# Patient Record
Sex: Male | Born: 1960
Health system: Southern US, Community
[De-identification: ages and names within clinical notes are randomized; demographics above are authoritative.]

## PROBLEM LIST (undated history)

## (undated) DIAGNOSIS — T7840XA Allergy, unspecified, initial encounter: Secondary | ICD-10-CM

## (undated) DIAGNOSIS — IMO0002 Reserved for concepts with insufficient information to code with codable children: Secondary | ICD-10-CM

## (undated) DIAGNOSIS — K219 Gastro-esophageal reflux disease without esophagitis: Secondary | ICD-10-CM

## (undated) DIAGNOSIS — R7309 Other abnormal glucose: Secondary | ICD-10-CM

## (undated) DIAGNOSIS — K648 Other hemorrhoids: Secondary | ICD-10-CM

## (undated) DIAGNOSIS — E785 Hyperlipidemia, unspecified: Secondary | ICD-10-CM

## (undated) DIAGNOSIS — N529 Male erectile dysfunction, unspecified: Secondary | ICD-10-CM

## (undated) DIAGNOSIS — K760 Fatty (change of) liver, not elsewhere classified: Secondary | ICD-10-CM

## (undated) DIAGNOSIS — L309 Dermatitis, unspecified: Secondary | ICD-10-CM

## (undated) DIAGNOSIS — R0683 Snoring: Secondary | ICD-10-CM

## (undated) DIAGNOSIS — I10 Essential (primary) hypertension: Secondary | ICD-10-CM

## (undated) DIAGNOSIS — Z86010 Personal history of colonic polyps: Secondary | ICD-10-CM

## (undated) HISTORY — DX: Reserved for concepts with insufficient information to code with codable children: IMO0002

## (undated) HISTORY — DX: Allergy, unspecified, initial encounter: T78.40XA

## (undated) HISTORY — DX: Dermatitis, unspecified: L30.9

## (undated) HISTORY — DX: Other hemorrhoids: K64.8

## (undated) HISTORY — DX: Personal history of colonic polyps: Z86.010

## (undated) HISTORY — DX: Other abnormal glucose: R73.09

## (undated) HISTORY — DX: Male erectile dysfunction, unspecified: N52.9

## (undated) HISTORY — DX: Snoring: R06.83

## (undated) HISTORY — DX: Gastro-esophageal reflux disease without esophagitis: K21.9

## (undated) HISTORY — DX: Essential (primary) hypertension: I10

## (undated) HISTORY — DX: Hyperlipidemia, unspecified: E78.5

## (undated) HISTORY — DX: Fatty (change of) liver, not elsewhere classified: K76.0

---

## 1988-03-14 HISTORY — PX: WISDOM TOOTH EXTRACTION: SHX21

## 1998-03-03 ENCOUNTER — Ambulatory Visit (HOSPITAL_COMMUNITY): Admission: RE | Admit: 1998-03-03 | Discharge: 1998-03-03 | Payer: Self-pay | Admitting: Internal Medicine

## 1998-03-03 ENCOUNTER — Encounter: Payer: Self-pay | Admitting: Internal Medicine

## 1998-10-27 ENCOUNTER — Ambulatory Visit (HOSPITAL_COMMUNITY): Admission: RE | Admit: 1998-10-27 | Discharge: 1998-10-27 | Payer: Self-pay | Admitting: Internal Medicine

## 1998-10-27 ENCOUNTER — Encounter: Payer: Self-pay | Admitting: Internal Medicine

## 2004-12-14 ENCOUNTER — Ambulatory Visit (HOSPITAL_COMMUNITY): Admission: RE | Admit: 2004-12-14 | Discharge: 2004-12-14 | Payer: Self-pay | Admitting: Internal Medicine

## 2004-12-14 ENCOUNTER — Ambulatory Visit: Payer: Self-pay | Admitting: Internal Medicine

## 2005-08-22 ENCOUNTER — Ambulatory Visit: Payer: Self-pay | Admitting: Internal Medicine

## 2006-01-13 ENCOUNTER — Ambulatory Visit: Payer: Self-pay | Admitting: Internal Medicine

## 2007-07-13 ENCOUNTER — Ambulatory Visit: Payer: Self-pay | Admitting: Internal Medicine

## 2007-07-13 DIAGNOSIS — J309 Allergic rhinitis, unspecified: Secondary | ICD-10-CM | POA: Insufficient documentation

## 2007-07-13 DIAGNOSIS — L299 Pruritus, unspecified: Secondary | ICD-10-CM | POA: Insufficient documentation

## 2007-07-13 DIAGNOSIS — R21 Rash and other nonspecific skin eruption: Secondary | ICD-10-CM | POA: Insufficient documentation

## 2007-07-16 LAB — CONVERTED CEMR LAB
ALT: 39 units/L (ref 0–53)
AST: 21 units/L (ref 0–37)
Albumin: 4.1 g/dL (ref 3.5–5.2)
Alkaline Phosphatase: 72 units/L (ref 39–117)
BUN: 11 mg/dL (ref 6–23)
Basophils Absolute: 0 10*3/uL (ref 0.0–0.1)
Basophils Relative: 0.7 % (ref 0.0–1.0)
Bilirubin, Direct: 0.2 mg/dL (ref 0.0–0.3)
CO2: 28 meq/L (ref 19–32)
Calcium: 9.2 mg/dL (ref 8.4–10.5)
Chloride: 111 meq/L (ref 96–112)
Creatinine, Ser: 1.1 mg/dL (ref 0.4–1.5)
Eosinophils Absolute: 0 10*3/uL (ref 0.0–0.7)
Eosinophils Relative: 0.7 % (ref 0.0–5.0)
GFR calc Af Amer: 93 mL/min
GFR calc non Af Amer: 77 mL/min
Glucose, Bld: 113 mg/dL — ABNORMAL HIGH (ref 70–99)
HCT: 45 % (ref 39.0–52.0)
Hemoglobin: 15.8 g/dL (ref 13.0–17.0)
Lymphocytes Relative: 23.4 % (ref 12.0–46.0)
MCHC: 35 g/dL (ref 30.0–36.0)
MCV: 91.4 fL (ref 78.0–100.0)
Monocytes Absolute: 0.4 10*3/uL (ref 0.1–1.0)
Monocytes Relative: 8.1 % (ref 3.0–12.0)
Neutro Abs: 3.7 10*3/uL (ref 1.4–7.7)
Neutrophils Relative %: 67.1 % (ref 43.0–77.0)
Platelets: 196 10*3/uL (ref 150–400)
Potassium: 4.6 meq/L (ref 3.5–5.1)
RBC: 4.92 M/uL (ref 4.22–5.81)
RDW: 11.5 % (ref 11.5–14.6)
Sodium: 141 meq/L (ref 135–145)
TSH: 0.74 microintl units/mL (ref 0.35–5.50)
Total Bilirubin: 0.9 mg/dL (ref 0.3–1.2)
Total Protein: 7 g/dL (ref 6.0–8.3)
WBC: 5.4 10*3/uL (ref 4.5–10.5)

## 2007-10-02 ENCOUNTER — Ambulatory Visit: Payer: Self-pay | Admitting: Internal Medicine

## 2008-10-07 ENCOUNTER — Telehealth: Payer: Self-pay | Admitting: Internal Medicine

## 2008-10-08 ENCOUNTER — Ambulatory Visit: Payer: Self-pay | Admitting: Internal Medicine

## 2008-10-08 DIAGNOSIS — R197 Diarrhea, unspecified: Secondary | ICD-10-CM | POA: Insufficient documentation

## 2009-02-09 ENCOUNTER — Ambulatory Visit: Payer: Self-pay | Admitting: Internal Medicine

## 2009-02-09 LAB — CONVERTED CEMR LAB
ALT: 89 units/L — ABNORMAL HIGH (ref 0–53)
AST: 62 units/L — ABNORMAL HIGH (ref 0–37)
Albumin: 4.3 g/dL (ref 3.5–5.2)
Alkaline Phosphatase: 62 units/L (ref 39–117)
BUN: 9 mg/dL (ref 6–23)
Basophils Absolute: 0 10*3/uL (ref 0.0–0.1)
Basophils Relative: 0.4 % (ref 0.0–3.0)
Bilirubin Urine: NEGATIVE
Bilirubin, Direct: 0.2 mg/dL (ref 0.0–0.3)
CO2: 28 meq/L (ref 19–32)
Calcium: 9.3 mg/dL (ref 8.4–10.5)
Chloride: 105 meq/L (ref 96–112)
Cholesterol: 219 mg/dL — ABNORMAL HIGH (ref 0–200)
Creatinine, Ser: 1.1 mg/dL (ref 0.4–1.5)
Direct LDL: 163.5 mg/dL
Eosinophils Absolute: 0.1 10*3/uL (ref 0.0–0.7)
Eosinophils Relative: 1.4 % (ref 0.0–5.0)
GFR calc non Af Amer: 75.93 mL/min (ref >60)
Glucose, Bld: 115 mg/dL — ABNORMAL HIGH (ref 70–99)
HCT: 47.5 % (ref 39.0–52.0)
HDL: 39.6 mg/dL (ref >39.00)
Hemoglobin: 16.6 g/dL (ref 13.0–17.0)
Ketones, ur: NEGATIVE mg/dL
Leukocytes, UA: NEGATIVE
Lymphocytes Relative: 25.8 % (ref 12.0–46.0)
Lymphs Abs: 1.4 10*3/uL (ref 0.7–4.0)
MCHC: 34.9 g/dL (ref 30.0–36.0)
MCV: 97.3 fL (ref 78.0–100.0)
Monocytes Absolute: 0.5 10*3/uL (ref 0.1–1.0)
Monocytes Relative: 9.8 % (ref 3.0–12.0)
Neutro Abs: 3.6 10*3/uL (ref 1.4–7.7)
Neutrophils Relative %: 62.6 % (ref 43.0–77.0)
Nitrite: NEGATIVE
PSA: 0.28 ng/mL (ref 0.10–4.00)
Platelets: 188 10*3/uL (ref 150.0–400.0)
Potassium: 4.2 meq/L (ref 3.5–5.1)
RBC: 4.88 M/uL (ref 4.22–5.81)
RDW: 12.3 % (ref 11.5–14.6)
Sodium: 141 meq/L (ref 135–145)
Specific Gravity, Urine: >=1.03 (ref 1.000–1.030)
TSH: 0.83 microintl units/mL (ref 0.35–5.50)
Total Bilirubin: 1 mg/dL (ref 0.3–1.2)
Total CHOL/HDL Ratio: 6
Total Protein: 7.2 g/dL (ref 6.0–8.3)
Triglycerides: 114 mg/dL (ref 0.0–149.0)
Urine Glucose: NEGATIVE mg/dL
Urobilinogen, UA: 0.2 (ref 0.0–1.0)
VLDL: 22.8 mg/dL (ref 0.0–40.0)
WBC: 5.6 10*3/uL (ref 4.5–10.5)
pH: 5.5 (ref 5.0–8.0)

## 2009-02-12 ENCOUNTER — Ambulatory Visit: Payer: Self-pay | Admitting: Internal Medicine

## 2009-02-12 DIAGNOSIS — R0989 Other specified symptoms and signs involving the circulatory and respiratory systems: Secondary | ICD-10-CM | POA: Insufficient documentation

## 2009-02-12 DIAGNOSIS — Z87891 Personal history of nicotine dependence: Secondary | ICD-10-CM | POA: Insufficient documentation

## 2009-02-12 DIAGNOSIS — R0609 Other forms of dyspnea: Secondary | ICD-10-CM | POA: Insufficient documentation

## 2009-02-12 DIAGNOSIS — I10 Essential (primary) hypertension: Secondary | ICD-10-CM | POA: Insufficient documentation

## 2009-02-12 DIAGNOSIS — R0602 Shortness of breath: Secondary | ICD-10-CM | POA: Insufficient documentation

## 2009-04-30 ENCOUNTER — Ambulatory Visit: Payer: Self-pay | Admitting: Internal Medicine

## 2009-04-30 DIAGNOSIS — IMO0002 Reserved for concepts with insufficient information to code with codable children: Secondary | ICD-10-CM

## 2009-04-30 DIAGNOSIS — M7020 Olecranon bursitis, unspecified elbow: Secondary | ICD-10-CM | POA: Insufficient documentation

## 2009-04-30 HISTORY — DX: Reserved for concepts with insufficient information to code with codable children: IMO0002

## 2009-05-01 ENCOUNTER — Encounter: Payer: Self-pay | Admitting: Internal Medicine

## 2009-05-01 LAB — CONVERTED CEMR LAB
Crystals, Fluid: NONE SEEN
Monocyte/Macrophage: 2 % — ABNORMAL LOW (ref 50–90)
Neutrophil, Synovial: 37 % — ABNORMAL HIGH (ref 0–25)
WBC, Synovial: 220 — ABNORMAL HIGH (ref 0–200)

## 2009-05-12 ENCOUNTER — Ambulatory Visit: Payer: Self-pay | Admitting: Internal Medicine

## 2009-05-12 LAB — CONVERTED CEMR LAB
ALT: 50 units/L (ref 0–53)
AST: 28 units/L (ref 0–37)
Albumin: 4.1 g/dL (ref 3.5–5.2)
Alkaline Phosphatase: 54 units/L (ref 39–117)
Bilirubin, Direct: 0.2 mg/dL (ref 0.0–0.3)
CO2: 28 meq/L (ref 19–32)
Calcium: 9.3 mg/dL (ref 8.4–10.5)
Chloride: 106 meq/L (ref 96–112)
Cholesterol: 206 mg/dL — ABNORMAL HIGH (ref 0–200)
Creatinine, Ser: 1 mg/dL (ref 0.4–1.5)
Direct LDL: 149.2 mg/dL
GFR calc non Af Amer: 84.67 mL/min (ref >60)
Glucose, Bld: 108 mg/dL — ABNORMAL HIGH (ref 70–99)
HDL: 40.8 mg/dL (ref >39.00)
Hgb A1c MFr Bld: 5.1 % (ref 4.6–6.5)
Potassium: 4.1 meq/L (ref 3.5–5.1)
Sodium: 141 meq/L (ref 135–145)
Total Bilirubin: 0.9 mg/dL (ref 0.3–1.2)
Total CHOL/HDL Ratio: 5
Total Protein: 7.1 g/dL (ref 6.0–8.3)
Triglycerides: 97 mg/dL (ref 0.0–149.0)
VLDL: 19.4 mg/dL (ref 0.0–40.0)

## 2009-05-15 ENCOUNTER — Ambulatory Visit: Payer: Self-pay | Admitting: Internal Medicine

## 2009-12-02 ENCOUNTER — Telehealth: Payer: Self-pay | Admitting: Internal Medicine

## 2010-02-08 ENCOUNTER — Ambulatory Visit: Payer: Self-pay | Admitting: Internal Medicine

## 2010-02-16 ENCOUNTER — Encounter: Payer: Self-pay | Admitting: Internal Medicine

## 2010-04-15 NOTE — Assessment & Plan Note (Signed)
Summary: ?left elbow infection/#/cd   Vital Signs:  Patient profile:   50 year old male Weight:      199 pounds Temp:     98.2 degrees F oral Pulse rate:   79 / minute BP sitting:   122 / 82  (left arm)  Vitals Entered By: Tora Perches (April 30, 2009 11:09 AM)  Procedure Note  Incision & Drainage: The patient complains of pain, redness, swelling, and discharge. Date of onset: 04/23/2009 Indication: infected lesion  Procedure # 1: I & D with packing    Size (in cm): 3.0 x 2.5    Region: posterior    Location: L elbow    Comment: Risks including but not limited by incomplete procedure, bleeding, infection, recurrence were discussed with the patient. Consent form was signed. Under sterile conditions 9 mm incision was made. Cavity probed w/foreceps and irrigated with Lido. 2" packing inserted. Tolerated well. Complicatons - none. Good pain relief following the procedure. Dressed w/gause, Silvadine and ACE wrap.    Instrument used: #10 blade    Anesthesia: 2.0 ml 1% lidocaine w/epinephrine  Cleaned and prepped with: alcohol, betadine, and deep debridement Wound dressing: bulky gauze dressing and pressure dressing Additional Instructions: See "Patient Instructions".   Injections: The patient complains of pain, redness, and swelling. Date of onset: 04/26/2009 Indication: acute pain  Procedure # 1: joint aspiration & injection    Region: posterior    Location: L elbow    Medication: none    Anesthesia: 0.5 ml 1% lidocaine w/o epinephrine    Comment: Risks including but not limited by incomplete procedure, bleeding, infection, recurrence were discussed with the patient. Consent form was signed.  Diagnostic joint tap was performed and 3 cc of yellow slightely turbid fluit was aspirated and sent for studies.  Cleaned and prepped with: alcohol and betadine Wound dressing: bulky gauze dressing  CC: infecy. on left elbow Is Patient Diabetic? No   Primary Care Provider:   Tresa Garter MD  CC:  infecy. on left elbow.  History of Present Illness: C/o L elbow pain and swelling on Thur, may have scratch it at work Had thick pus come out 2 d ago. Elbow was red, swollen and tender; actually - better today...  Preventive Screening-Counseling & Management  Alcohol-Tobacco     Smoking Status: quit  Current Medications (verified): 1)  Omeprazole 20 Mg Cpdr (Omeprazole) .... One By Mouth Daily 2)  Vitamin D 1000 Unit Tabs (Cholecalciferol) .Marland Kitchen.. 1 By Mouth Qd 3)  Cozaar 100 Mg Tabs (Losartan Potassium) .Marland Kitchen.. 1 By Mouth 4)  Zolpidem Tartrate 10 Mg Tabs (Zolpidem Tartrate) .... 1/2-1 Tab At Bedtime As Needed Insomnia  Allergies (verified): No Known Drug Allergies  Past History:  Past Medical History: Last updated: 02/12/2009 Allergic rhinitis Eczema ED GERD Pneumothorax Fatty liver Hyperlipidemia Hypertension 2010 Elev glu 2010 Snoring/poss OSA  Social History: Last updated: 02/12/2009 Occupation: [employer] Married Former Smoker Alcohol use-yes 2/d Regular exercise-yes  Review of Systems  The patient denies fever.    Physical Exam  General:  alert, well-developed, well-nourished, and cooperative to examination.   nontoxicoverweight-appearing.   Mouth:  Oral mucosa and oropharynx without lesions or exudates.  Teeth in good repair. Lungs:  Normal respiratory effort, chest expands symmetrically. Lungs are clear to auscultation, no crackles or wheezes. Heart:  Normal rate and regular rhythm. S1 and S2 normal without gallop, murmur, click, rub or other extra sounds. Msk:  L elbow w/red swollen olecr. bursa and  redness  spread medially- tender ROM OK Pulses:  R and L carotid,radial,femoral,dorsalis pedis and posterior tibial pulses are full and equal bilaterally Neurologic:  No cranial nerve deficits noted. Station and gait are normal. Plantar reflexes are down-going bilaterally. DTRs are symmetrical throughout. Sensory, motor and  coordinative functions appear intact. Skin:  as above   Impression & Recommendations:  Problem # 1:  OLECRANON BURSITIS, LEFT (ICD-726.33) infectious Assessment New Rocephin. Start Doxy and Mupirocin oint. Vicodin as needed. Orders: T-Gram Stain ([id number]) T-Culture, Wound ([id number]) T- * Misc. Laboratory test [id number]) Ace Wraps 3-5 in/yard  (N5621) Joint Aspirate / Injection, Large (20610) I&D Abscess, Complex (10061)  Problem # 2:  HYPERTENSION (ICD-401.9) Assessment: Improved  His updated medication list for this problem includes:    Cozaar 100 Mg Tabs (Losartan potassium) .Marland Kitchen... 1 by mouth  BP today: 122/82 Prior BP: 160/100 (02/12/2009)  Labs Reviewed: K+: 4.2 (02/09/2009) Creat: : 1.1 (02/09/2009)   Chol: 219 (02/09/2009)   HDL: 39.60 (02/09/2009)   TG: 114.0 (02/09/2009)  Complete Medication List: 1)  Omeprazole 20 Mg Cpdr (Omeprazole) .... One by mouth daily 2)  Vitamin D 1000 Unit Tabs (Cholecalciferol) .Marland Kitchen.. 1 by mouth qd 3)  Cozaar 100 Mg Tabs (Losartan potassium) .Marland Kitchen.. 1 by mouth 4)  Zolpidem Tartrate 10 Mg Tabs (Zolpidem tartrate) .... 1/2-1 tab at bedtime as needed insomnia 5)  Hydrocodone-acetaminophen 5-325 Mg Tabs (Hydrocodone-acetaminophen) .Marland Kitchen.. 1-2 by mouth two times a day as needed pain 6)  Doxycycline Monohydrate 100 Mg Caps (Doxycycline monohydrate) .Marland Kitchen.. 1 by mouth two times a day with a glass of water 7)  Mupirocin 2 % Oint (Mupirocin) .... Use bid  Other Orders: Rocephin  250mg  (H0865) Admin of Therapeutic Inj  intramuscular or subcutaneous (78469)  Patient Instructions: 1)  Call if you are not better in a reasonable amount of time or if worse.  2)  Please schedule a follow-up appointment in 2 weeks. 3)  Change dressing bid Prescriptions: MUPIROCIN 2 % OINT (MUPIROCIN) use bid  #45 g x 0   Entered and Authorized by:   Tresa Garter MD   Signed by:   Tresa Garter MD on 04/30/2009   Method used:   Print then Give to  Patient   RxID:   [id number] DOXYCYCLINE MONOHYDRATE 100 MG CAPS (DOXYCYCLINE MONOHYDRATE) 1 by mouth two times a day with a glass of water  #20 x 0   Entered and Authorized by:   Tresa Garter MD   Signed by:   Tresa Garter MD on 04/30/2009   Method used:   Print then Give to Patient   RxID:   [id number] HYDROCODONE-ACETAMINOPHEN 5-325 MG TABS (HYDROCODONE-ACETAMINOPHEN) 1-2 by mouth two times a day as needed pain  #20 x 0   Entered and Authorized by:   Tresa Garter MD   Signed by:   Tresa Garter MD on 04/30/2009   Method used:   Print then Give to Patient   RxID:   [id number]     Medication Administration  Injection # 1:    Medication: Rocephin  250mg     Diagnosis: CELLULITIS, ARM (ICD-682.3)    Route: IM    Site: LUOQ gluteus    Exp Date: 06/2010    Lot #: [id number]    Mfr: sandoz    Comments: 1 gram given    Patient tolerated injection without complications    Given by: Tora Perches (April 30, 2009 1:40 PM)  Orders Added: 1)  T-Gram Stain [[id number]] 2)  T-Culture, Wound [[id number]] 3)  T- * Misc. Laboratory test [99999] 4)  Ace Wraps 3-5 in/yard  [A6449] 5)  Joint Aspirate / Injection, Large [20610] 6)  I&D Abscess, Complex [10061] 7)  Est. Patient Level III [16109] 8)  Rocephin  250mg  [J0696] 9)  Admin of Therapeutic Inj  intramuscular or subcutaneous [60454]

## 2010-04-15 NOTE — Miscellaneous (Signed)
Summary: Flu Vaccination/[facility]  Flu Vaccination/[facility]   Imported By: Sherian Rein 02/18/2010 11:50:20  _____________________________________________________________________  External Attachment:    Type:   Image     Comment:   External Document

## 2010-04-15 NOTE — Progress Notes (Signed)
Summary: Rf Ambien  Phone Note Refill Request   Refills Requested: Medication #1:  ZOLPIDEM TARTRATE 10 MG TABS 1/2-1 tab at bedtime as needed insomnia   Dosage confirmed as above?Dosage Confirmed   Supply Requested: 90   Last Refilled: 09/02/2009  Method Requested: Telephone to Pharmacy Initial call taken by: Lanier Prude, [facility]),  December 02, 2009 10:58 AM  Follow-up for Phone Call        ok x 3 months  Follow-up by: Tresa Garter MD,  December 02, 2009 5:43 PM  Additional Follow-up for Phone Call Additional follow up Details #1::        Rx called to pharmacy Additional Follow-up by: Lanier Prude, [facility]),  December 03, 2009 8:17 AM    Prescriptions: ZOLPIDEM TARTRATE 10 MG TABS (ZOLPIDEM TARTRATE) 1/2-1 tab at bedtime as needed insomnia  #90 x 1   Entered by:   Lanier Prude, CMA(AAMA)   Authorized by:   Tresa Garter MD   Signed by:   Lanier Prude, CMA(AAMA) on 12/03/2009   Method used:   Telephoned to ...       [facility] [address] [phone]* (retail)       [address]       [address]       [place], [place]  [address]       Ph: [phone]       Fax: [phone]   RxID:   [id number]

## 2010-04-15 NOTE — Miscellaneous (Signed)
Summary: [facility]  [facility]   Imported By: Sherian Rein 05/05/2009 07:43:45  _____________________________________________________________________  External Attachment:    Type:   Image     Comment:   External Document

## 2010-04-15 NOTE — Assessment & Plan Note (Signed)
Summary: 3 MO ROV /NWS #   Vital Signs:  Patient profile:   50 year old male Weight:      200 pounds Temp:     98.3 degrees F oral Pulse rate:   68 / minute BP sitting:   140 / 80  (left arm)  Vitals Entered By: Tora Perches (May 15, 2009 2:24 PM) CC: f/u Is Patient Diabetic? No   Primary Care Provider:  Tresa Garter MD  CC:  f/u.  History of Present Illness: F/u L elbow pain, elev BP, DOE. /u elev BP and labs C/o raw mouth and L arm rash on Doxy  Preventive Screening-Counseling & Management  Alcohol-Tobacco     Smoking Status: quit  Allergies (verified): 1)  ! Doxycycline  Past History:  Past Medical History: Last updated: 02/12/2009 Allergic rhinitis Eczema ED GERD Pneumothorax Fatty liver Hyperlipidemia Hypertension 2010 Elev glu 2010 Snoring/poss OSA  Social History: Last updated: 02/12/2009 Occupation: [employer] Married Former Smoker Alcohol use-yes 2/d Regular exercise-yes  Review of Systems  The patient denies fever, dyspnea on exertion, abdominal pain, and hematochezia.    Physical Exam  General:  alert, well-developed, well-nourished, and cooperative to examination.   nontoxicoverweight-appearing.   Nose:  External nasal examination shows no deformity or inflammation. Nasal mucosa are pink and moist without lesions or exudates. Mouth:  Oral mucosa and oropharynx without lesions or exudates.  Teeth in good repair. Lungs:  Normal respiratory effort, chest expands symmetrically. Lungs are clear to auscultation, no crackles or wheezes. Heart:  Normal rate and regular rhythm. S1 and S2 normal without gallop, murmur, click, rub or other extra sounds. Abdomen:  Bowel sounds positive,abdomen soft and non-tender without masses, organomegaly or hernias noted. Msk:  L elbow w/residually swollen olecr. bursa and  no redness, non- tender ROM OK Neurologic:  No cranial nerve deficits noted. Station and gait are normal. Plantar reflexes are  down-going bilaterally. DTRs are symmetrical throughout. Sensory, motor and coordinative functions appear intact. Skin:  erythem - pap rash on L arm Psych:  Cognition and judgment appear intact. Alert and cooperative with normal attention span and concentration. No apparent delusions, illusions, hallucinations   Impression & Recommendations:  Problem # 1:  OLECRANON BURSITIS, LEFT (ICD-726.33) Assessment Improved Will tap if not better Pennsaid given  Problem # 2:  HYPERTENSION (ICD-401.9) Assessment: Improved  His updated medication list for this problem includes:    Cozaar 100 Mg Tabs (Losartan potassium) .Marland Kitchen... 1 by mouth  Problem # 3:  DYSPNEA (ICD-786.05) Assessment: Improved  Problem # 4:  HYPERLIPIDEMIA (ICD-272.4) Assessment: Comment Only On a better diet now  Problem # 5:  RASH AND OTHER NONSPECIFIC SKIN ERUPTION (ICD-782.1) due to Doxy Assessment: New  His updated medication list for this problem includes:    Triamcinolone Acetonide 0.5 % Crea (Triamcinolone acetonide) ..... Use two times a day prn  Complete Medication List: 1)  Omeprazole 20 Mg Cpdr (Omeprazole) .... One by mouth daily 2)  Vitamin D 1000 Unit Tabs (Cholecalciferol) .Marland Kitchen.. 1 by mouth qd 3)  Cozaar 100 Mg Tabs (Losartan potassium) .Marland Kitchen.. 1 by mouth 4)  Zolpidem Tartrate 10 Mg Tabs (Zolpidem tartrate) .... 1/2-1 tab at bedtime as needed insomnia 5)  Pennsaid 1.5 % Soln (Diclofenac sodium) .... 3-5 gtt on skin three times a day for pain 6)  Triamcinolone Acetonide 0.5 % Crea (Triamcinolone acetonide) .... Use two times a day prn 7)  Pennsaid 1.5 % Soln (Diclofenac sodium) .... 3-5 gtt on skin three  times a day for pain  Patient Instructions: 1)  Please schedule a follow-up appointment in 6 months well w/labs. Prescriptions: ZOLPIDEM TARTRATE 10 MG TABS (ZOLPIDEM TARTRATE) 1/2-1 tab at bedtime as needed insomnia  #90 x 1   Entered and Authorized by:   Tresa Garter MD   Signed by:   Tresa Garter MD on 05/15/2009   Method used:   Print then Mail to Patient   RxID:   [id number] PENNSAID 1.5 % SOLN (DICLOFENAC SODIUM) 3-5 gtt on skin three times a day for pain  #1 x 3   Entered and Authorized by:   Tresa Garter MD   Signed by:   Tresa Garter MD on 05/15/2009   Method used:   Print then Mail to Patient   RxID:   [id number] COZAAR 100 MG TABS (LOSARTAN POTASSIUM) 1 by mouth  #90 x 3   Entered and Authorized by:   Tresa Garter MD   Signed by:   Tresa Garter MD on 05/15/2009   Method used:   Print then Mail to Patient   RxID:   [id number] OMEPRAZOLE 20 MG CPDR (OMEPRAZOLE) one by mouth daily  #90 x 3   Entered and Authorized by:   Tresa Garter MD   Signed by:   Tresa Garter MD on 05/15/2009   Method used:   Print then Mail to Patient   RxID:   [id number] TRIAMCINOLONE ACETONIDE 0.5 % CREA (TRIAMCINOLONE ACETONIDE) use two times a day prn  #120 g x 3   Entered and Authorized by:   Tresa Garter MD   Signed by:   Tresa Garter MD on 05/15/2009   Method used:   Electronically to        [facility] [address] [phone]* (retail)       [address]       [address]       [place], [place]  [address]       Ph: [phone]       Fax: [phone]   RxID:   [id number]

## 2010-06-01 ENCOUNTER — Other Ambulatory Visit: Payer: Self-pay | Admitting: Internal Medicine

## 2010-06-02 NOTE — Telephone Encounter (Signed)
OK FOR RF?

## 2010-06-09 NOTE — Telephone Encounter (Signed)
OK to fill this prescription with additional refills x2 Thank you!  

## 2010-06-09 NOTE — Telephone Encounter (Signed)
Called rx in  

## 2010-07-22 ENCOUNTER — Other Ambulatory Visit: Payer: Self-pay | Admitting: Internal Medicine

## 2010-08-30 ENCOUNTER — Other Ambulatory Visit: Payer: Self-pay | Admitting: Internal Medicine

## 2010-08-30 NOTE — Telephone Encounter (Signed)
Ok to Rf? 

## 2010-08-31 ENCOUNTER — Other Ambulatory Visit: Payer: Self-pay | Admitting: *Deleted

## 2010-08-31 MED ORDER — ZOLPIDEM TARTRATE 10 MG PO TABS: 10.0000 mg | ORAL_TABLET | Freq: Every evening | ORAL | Status: DC | PRN

## 2010-11-30 ENCOUNTER — Other Ambulatory Visit: Payer: Self-pay | Admitting: Internal Medicine

## 2010-11-30 NOTE — Telephone Encounter (Signed)
Please Advise refill 

## 2010-11-30 NOTE — Telephone Encounter (Signed)
Refill request for zolpidem 10 mg SIG take 1/2 -1 tablet by mouth at bedtime prn QTY 90 last refill was 09/04/2010. Please Advise

## 2010-12-02 ENCOUNTER — Telehealth: Payer: Self-pay | Admitting: *Deleted

## 2010-12-02 NOTE — Telephone Encounter (Signed)
Ami Bullins, CMA 11/30/2010 3:57 PM Signed  Refill request for zolpidem 10 mg SIG take 1/2 -1 tablet by mouth at bedtime prn QTY 90 last refill was 09/04/2010. Please Advise

## 2010-12-03 MED ORDER — ZOLPIDEM TARTRATE 10 MG PO TABS: 10.0000 mg | ORAL_TABLET | Freq: Every evening | ORAL | Status: DC | PRN

## 2010-12-03 NOTE — Telephone Encounter (Signed)
OK to fill this prescription with additional refills x0 Needs OV Thank you!  

## 2010-12-13 ENCOUNTER — Other Ambulatory Visit: Payer: Self-pay | Admitting: Internal Medicine

## 2011-01-12 ENCOUNTER — Other Ambulatory Visit: Payer: Self-pay | Admitting: Internal Medicine

## 2011-03-02 ENCOUNTER — Other Ambulatory Visit: Payer: Self-pay | Admitting: Internal Medicine

## 2011-03-04 ENCOUNTER — Encounter: Payer: Self-pay | Admitting: Internal Medicine

## 2011-03-07 NOTE — Telephone Encounter (Signed)
Zolpidem request  [last refill 09.21.12 #90x0]

## 2011-03-09 ENCOUNTER — Encounter: Payer: Self-pay | Admitting: Internal Medicine

## 2011-03-09 ENCOUNTER — Ambulatory Visit (INDEPENDENT_AMBULATORY_CARE_PROVIDER_SITE_OTHER): Payer: BC Managed Care – PPO | Admitting: Internal Medicine

## 2011-03-09 DIAGNOSIS — R21 Rash and other nonspecific skin eruption: Secondary | ICD-10-CM

## 2011-03-09 DIAGNOSIS — Z0000 Encounter for general adult medical examination without abnormal findings: Secondary | ICD-10-CM

## 2011-03-09 DIAGNOSIS — J019 Acute sinusitis, unspecified: Secondary | ICD-10-CM | POA: Insufficient documentation

## 2011-03-09 DIAGNOSIS — L299 Pruritus, unspecified: Secondary | ICD-10-CM

## 2011-03-09 MED ORDER — AMOXICILLIN-POT CLAVULANATE 875-125 MG PO TABS: 1.0000 | ORAL_TABLET | Freq: Two times a day (BID) | ORAL | Status: AC

## 2011-03-09 MED ORDER — LOSARTAN POTASSIUM 100 MG PO TABS: 100.0000 mg | ORAL_TABLET | Freq: Every day | ORAL | Status: DC

## 2011-03-09 MED ORDER — OMEPRAZOLE 20 MG PO CPDR: 20.0000 mg | DELAYED_RELEASE_CAPSULE | Freq: Every day | ORAL | Status: DC

## 2011-03-09 MED ORDER — KETOCONAZOLE 2 % EX CREA: TOPICAL_CREAM | Freq: Every day | CUTANEOUS | Status: AC

## 2011-03-09 MED ORDER — FLUCONAZOLE 100 MG PO TABS: ORAL_TABLET | ORAL | Status: AC

## 2011-03-09 MED ORDER — HYDROXYZINE HCL 25 MG PO TABS: ORAL_TABLET | ORAL | Status: DC

## 2011-03-09 MED ORDER — CLOTRIMAZOLE-BETAMETHASONE 1-0.05 % EX CREA: TOPICAL_CREAM | Freq: Two times a day (BID) | CUTANEOUS | Status: DC

## 2011-03-09 NOTE — Assessment & Plan Note (Signed)
Intertrigo and poss strep infection superseding: start Diflucan and Dxycycline Start Lotrisone x 2 wks , then Ketoconazole

## 2011-03-09 NOTE — Progress Notes (Signed)
  Subjective:    Patient ID: Dale Campbell, male    DOB: 01-30-1961, 50 y.o.   MRN: [id number]  HPI  C/o rash on B buttocks and B thighs x 1 year - a lot of itching. C/o pain and stiffness on L. C/o sinus infection  Review of Systems  Constitutional: Negative for appetite change, fatigue and unexpected weight change.  HENT: Negative for nosebleeds, congestion, sore throat, sneezing, trouble swallowing and neck pain.   Eyes: Negative for itching and visual disturbance.  Respiratory: Negative for cough.   Cardiovascular: Negative for chest pain, palpitations and leg swelling.  Gastrointestinal: Negative for nausea, diarrhea, blood in stool and abdominal distention.  Genitourinary: Negative for frequency and hematuria.  Musculoskeletal: Negative for back pain, joint swelling and gait problem.  Skin: Positive for rash. Negative for wound.  Neurological: Negative for dizziness, tremors, speech difficulty and weakness.  Psychiatric/Behavioral: Negative for sleep disturbance, dysphoric mood and agitation. The patient is not nervous/anxious.        Objective:   Physical Exam  Constitutional: He is oriented to person, place, and time. He appears well-developed.  HENT:  Mouth/Throat: Oropharynx is clear and moist.  Eyes: Conjunctivae are normal. Pupils are equal, round, and reactive to light.  Neck: Normal range of motion. No JVD present. No thyromegaly present.  Cardiovascular: Normal rate, regular rhythm, normal heart sounds and intact distal pulses.  Exam reveals no gallop and no friction rub.   No murmur heard. Pulmonary/Chest: Effort normal and breath sounds normal. No respiratory distress. He has no wheezes. He has no rales. He exhibits no tenderness.  Abdominal: Soft. Bowel sounds are normal. He exhibits no distension and no mass. There is no tenderness. There is no rebound and no guarding.  Musculoskeletal: Normal range of motion. He exhibits no edema and no tenderness.    Lymphadenopathy:    He has no cervical adenopathy.  Neurological: He is alert and oriented to person, place, and time. He has normal reflexes. No cranial nerve deficit. He exhibits normal muscle tone. Coordination normal.  Skin: Skin is warm and dry. Rash noted.       Scaly eryth rash over buttocks and groin - extensive  Psychiatric: He has a normal mood and affect. His behavior is normal. Judgment and thought content normal.          Assessment & Plan:

## 2011-03-09 NOTE — Assessment & Plan Note (Signed)
Doxy x 10 d 

## 2011-03-09 NOTE — Assessment & Plan Note (Signed)
Hydroxyzine.

## 2011-03-09 NOTE — Patient Instructions (Signed)
Use Nylon underwear

## 2011-03-11 ENCOUNTER — Telehealth: Payer: Self-pay | Admitting: *Deleted

## 2011-03-11 MED ORDER — ZOLPIDEM TARTRATE 10 MG PO TABS: 10.0000 mg | ORAL_TABLET | Freq: Every evening | ORAL | Status: DC | PRN

## 2011-03-11 NOTE — Telephone Encounter (Signed)
Rf req for Zolpidem 10 mg 1 po qhs. # 90. Last filled 01-29-11. Ok to Rf?

## 2011-03-11 NOTE — Telephone Encounter (Signed)
OK to fill this prescription with additional refills x3 Thank you!  

## 2011-04-18 ENCOUNTER — Other Ambulatory Visit (INDEPENDENT_AMBULATORY_CARE_PROVIDER_SITE_OTHER): Payer: BC Managed Care – PPO

## 2011-04-18 DIAGNOSIS — L299 Pruritus, unspecified: Secondary | ICD-10-CM

## 2011-04-18 DIAGNOSIS — R21 Rash and other nonspecific skin eruption: Secondary | ICD-10-CM

## 2011-04-18 DIAGNOSIS — Z0000 Encounter for general adult medical examination without abnormal findings: Secondary | ICD-10-CM

## 2011-04-18 DIAGNOSIS — J019 Acute sinusitis, unspecified: Secondary | ICD-10-CM

## 2011-04-18 LAB — URINALYSIS
Bilirubin Urine: NEGATIVE
Ketones, ur: NEGATIVE
Leukocytes, UA: NEGATIVE
Total Protein, Urine: NEGATIVE
Urine Glucose: NEGATIVE
pH: 5.5 (ref 5.0–8.0)

## 2011-04-18 LAB — CBC WITH DIFFERENTIAL/PLATELET
Basophils Absolute: 0 10*3/uL (ref 0.0–0.1)
Eosinophils Absolute: 0.1 10*3/uL (ref 0.0–0.7)
Hemoglobin: 15.7 g/dL (ref 13.0–17.0)
Lymphocytes Relative: 29.8 % (ref 12.0–46.0)
Lymphs Abs: 1.4 10*3/uL (ref 0.7–4.0)
MCHC: 34.7 g/dL (ref 30.0–36.0)
Neutro Abs: 2.8 10*3/uL (ref 1.4–7.7)
Platelets: 223 10*3/uL (ref 150.0–400.0)
RDW: 12.8 % (ref 11.5–14.6)

## 2011-04-18 LAB — HEPATIC FUNCTION PANEL
ALT: 47 U/L (ref 0–53)
AST: 32 U/L (ref 0–37)
Alkaline Phosphatase: 60 U/L (ref 39–117)
Total Bilirubin: 0.7 mg/dL (ref 0.3–1.2)

## 2011-04-18 LAB — BASIC METABOLIC PANEL
BUN: 11 mg/dL (ref 6–23)
CO2: 27 mEq/L (ref 19–32)
Calcium: 9.4 mg/dL (ref 8.4–10.5)
Glucose, Bld: 100 mg/dL — ABNORMAL HIGH (ref 70–99)
Sodium: 139 mEq/L (ref 135–145)

## 2011-04-18 LAB — TSH: TSH: 1.14 u[IU]/mL (ref 0.35–5.50)

## 2011-04-18 LAB — LIPID PANEL: Cholesterol: 200 mg/dL (ref 0–200)

## 2011-04-22 ENCOUNTER — Encounter: Payer: Self-pay | Admitting: Internal Medicine

## 2011-04-22 ENCOUNTER — Ambulatory Visit (INDEPENDENT_AMBULATORY_CARE_PROVIDER_SITE_OTHER)
Admission: RE | Admit: 2011-04-22 | Discharge: 2011-04-22 | Disposition: A | Discharge status: Home / Self Care | Payer: BC Managed Care – PPO | Source: Ambulatory Visit | Attending: Internal Medicine | Admitting: Internal Medicine

## 2011-04-22 ENCOUNTER — Ambulatory Visit (INDEPENDENT_AMBULATORY_CARE_PROVIDER_SITE_OTHER): Payer: BC Managed Care – PPO | Admitting: Internal Medicine

## 2011-04-22 VITALS — BP 140/88 | HR 76 | Temp 98.5°F | Resp 16 | Wt 198.0 lb

## 2011-04-22 DIAGNOSIS — M542 Cervicalgia: Secondary | ICD-10-CM

## 2011-04-22 DIAGNOSIS — I10 Essential (primary) hypertension: Secondary | ICD-10-CM

## 2011-04-22 DIAGNOSIS — Z23 Encounter for immunization: Secondary | ICD-10-CM

## 2011-04-22 DIAGNOSIS — R21 Rash and other nonspecific skin eruption: Secondary | ICD-10-CM

## 2011-04-22 DIAGNOSIS — E785 Hyperlipidemia, unspecified: Secondary | ICD-10-CM

## 2011-04-22 MED ORDER — TRAMADOL HCL 50 MG PO TABS: 50.0000 mg | ORAL_TABLET | Freq: Two times a day (BID) | ORAL | Status: AC | PRN

## 2011-04-22 MED ORDER — LOSARTAN POTASSIUM-HCTZ 100-12.5 MG PO TABS: 1.0000 | ORAL_TABLET | Freq: Every day | ORAL | Status: DC

## 2011-04-22 MED ORDER — MELOXICAM 15 MG PO TABS: 15.0000 mg | ORAL_TABLET | Freq: Every day | ORAL | Status: AC | PRN

## 2011-04-22 NOTE — Assessment & Plan Note (Signed)
See med change 

## 2011-04-22 NOTE — Progress Notes (Signed)
  Subjective:    Patient ID: Dale Campbell, male    DOB: [birth year], 51 y.o.   MRN: [id number]  HPI  The patient presents for a follow-up of  chronic hypertension, chronic dyslipidemia,rash in buttocks controlled better with medicines C/o neck pain and B shoulder pain 4-9/10 x long time, worse in the past month     Review of Systems  Constitutional: Negative for appetite change, fatigue and unexpected weight change.  HENT: Negative for nosebleeds, congestion, sore throat, sneezing, trouble swallowing and neck pain.   Eyes: Negative for itching and visual disturbance.  Respiratory: Negative for cough.   Cardiovascular: Negative for chest pain, palpitations and leg swelling.  Gastrointestinal: Negative for nausea, diarrhea, blood in stool and abdominal distention.  Genitourinary: Negative for frequency and hematuria.  Musculoskeletal: Positive for back pain (neck). Negative for joint swelling and gait problem.  Skin: Positive for rash (better).  Neurological: Negative for dizziness, tremors, speech difficulty and weakness.  Psychiatric/Behavioral: Negative for sleep disturbance, dysphoric mood and agitation. The patient is not nervous/anxious.        Objective:   Physical Exam  Constitutional: He is oriented to person, place, and time. He appears well-developed.  HENT:  Mouth/Throat: Oropharynx is clear and moist.  Eyes: Conjunctivae are normal. Pupils are equal, round, and reactive to light.  Neck: Normal range of motion. No JVD present. No thyromegaly present.  Cardiovascular: Normal rate, regular rhythm, normal heart sounds and intact distal pulses.  Exam reveals no gallop and no friction rub.   No murmur heard. Pulmonary/Chest: Effort normal and breath sounds normal. No respiratory distress. He has no wheezes. He has no rales. He exhibits no tenderness.  Abdominal: Soft. Bowel sounds are normal. He exhibits no distension and no mass. There is no tenderness. There is no  rebound and no guarding.  Musculoskeletal: Normal range of motion. He exhibits tenderness (L parasp neck is tender). He exhibits no edema.  Lymphadenopathy:    He has no cervical adenopathy.  Neurological: He is alert and oriented to person, place, and time. He has normal reflexes. No cranial nerve deficit. He exhibits normal muscle tone. Coordination normal.  Skin: Skin is warm and dry. No rash noted.  Psychiatric: He has a normal mood and affect. His behavior is normal. Judgment and thought content normal.   Lab Results  Component Value Date   WBC 4.8 04/18/2011   HGB 15.7 04/18/2011   HCT 45.3 04/18/2011   PLT 223.0 04/18/2011   GLUCOSE 100* 04/18/2011   CHOL 200 04/18/2011   TRIG 87.0 04/18/2011   HDL 39.80 04/18/2011   LDLDIRECT 149.2 05/12/2009   LDLCALC 143* 04/18/2011   ALT 47 04/18/2011   AST 32 04/18/2011   NA 139 04/18/2011   K 4.2 04/18/2011   CL 103 04/18/2011   CREATININE 0.9 04/18/2011   BUN 11 04/18/2011   CO2 27 04/18/2011   TSH 1.14 04/18/2011   PSA 0.28 02/09/2009   HGBA1C 5.1 05/12/2009          Assessment & Plan:

## 2011-04-22 NOTE — Patient Instructions (Signed)
Contour pillow  

## 2011-04-22 NOTE — Assessment & Plan Note (Signed)
On diet only  ?

## 2011-04-22 NOTE — Assessment & Plan Note (Signed)
Better w/meds

## 2011-04-23 ENCOUNTER — Encounter: Payer: Self-pay | Admitting: Internal Medicine

## 2011-04-25 ENCOUNTER — Telehealth: Payer: Self-pay | Admitting: *Deleted

## 2011-04-25 NOTE — Telephone Encounter (Signed)
Message copied by Merrilyn Puma on Mon Apr 25, 2011  2:41 PM ------      Message from: Janeal Holmes      Created: Mon Apr 25, 2011 10:03 AM       Misty Stanley, please, inform patient that he has a mild DJD of the c spine      Thank you!

## 2011-04-25 NOTE — Telephone Encounter (Signed)
Left mess for patient to call back.  

## 2011-04-26 NOTE — Telephone Encounter (Signed)
Left detailed mess informing pt of below.  

## 2011-04-29 ENCOUNTER — Telehealth: Payer: Self-pay

## 2011-04-29 NOTE — Telephone Encounter (Signed)
Pt called stating that Tramadol does not help with neck pain and causes nausea. Pt is requesting alternate treatment, please advise.

## 2011-04-29 NOTE — Telephone Encounter (Signed)
Pt advised per MD to D/C Tramadol and use Vicodin. Pt allergies verified. Left message on machine for pt to return my call regarding medication change.

## 2011-05-03 MED ORDER — HYDROCODONE-ACETAMINOPHEN 5-325 MG PO TABS: 1.0000 | ORAL_TABLET | Freq: Two times a day (BID) | ORAL | Status: DC | PRN

## 2011-05-03 NOTE — Telephone Encounter (Signed)
Pt would like something else in place of Tramadol.

## 2011-05-03 NOTE — Telephone Encounter (Signed)
Call in hydroc-apap pls Thx

## 2011-05-04 NOTE — Telephone Encounter (Signed)
Rx phoned in. Pt informed.

## 2011-06-22 ENCOUNTER — Ambulatory Visit (INDEPENDENT_AMBULATORY_CARE_PROVIDER_SITE_OTHER): Payer: BC Managed Care – PPO | Admitting: Internal Medicine

## 2011-06-22 ENCOUNTER — Encounter: Payer: Self-pay | Admitting: Internal Medicine

## 2011-06-22 VITALS — BP 118/80 | HR 80 | Temp 97.7°F | Resp 16 | Wt 195.0 lb

## 2011-06-22 DIAGNOSIS — H6690 Otitis media, unspecified, unspecified ear: Secondary | ICD-10-CM

## 2011-06-22 DIAGNOSIS — I10 Essential (primary) hypertension: Secondary | ICD-10-CM

## 2011-06-22 DIAGNOSIS — M542 Cervicalgia: Secondary | ICD-10-CM

## 2011-06-22 DIAGNOSIS — R21 Rash and other nonspecific skin eruption: Secondary | ICD-10-CM

## 2011-06-22 MED ORDER — AMOXICILLIN 500 MG PO CAPS: 1000.0000 mg | ORAL_CAPSULE | Freq: Two times a day (BID) | ORAL | Status: AC

## 2011-06-22 NOTE — Assessment & Plan Note (Signed)
Better Continue with current prescription therapy as reflected on the Med list.  

## 2011-06-22 NOTE — Assessment & Plan Note (Signed)
4/13 R Amox x 10 d

## 2011-06-22 NOTE — Assessment & Plan Note (Signed)
Continue with current prescription therapy as reflected on the Med list.  

## 2011-06-22 NOTE — Progress Notes (Signed)
Patient ID: Dale Campbell, male   DOB: 07/20/1960, 51 y.o.   MRN: [id number]  Subjective:    Patient ID: Dale Campbell, male    DOB: Aug 11, 1960, 51 y.o.   MRN: [id number]  Neck Pain  Pertinent negatives include no chest pain, trouble swallowing or weakness.    The patient presents for a follow-up of  chronic hypertension, chronic dyslipidemia,rash in buttocks controlled better with medicines C/o neck pain and B shoulder pain 3-8/10 lately x long time, worse in the past month; Vicodin helps C/o R ear pain x 3 d    Review of Systems  Constitutional: Negative for appetite change, fatigue and unexpected weight change.  HENT: Positive for neck pain. Negative for nosebleeds, congestion, sore throat, sneezing and trouble swallowing.   Eyes: Negative for itching and visual disturbance.  Respiratory: Negative for cough.   Cardiovascular: Negative for chest pain, palpitations and leg swelling.  Gastrointestinal: Negative for nausea, diarrhea, blood in stool and abdominal distention.  Genitourinary: Negative for frequency and hematuria.  Musculoskeletal: Positive for back pain (neck). Negative for joint swelling and gait problem.  Skin: Positive for rash (better).  Neurological: Negative for dizziness, tremors, speech difficulty and weakness.  Psychiatric/Behavioral: Negative for sleep disturbance, dysphoric mood and agitation. The patient is not nervous/anxious.        Objective:   Physical Exam  Constitutional: He is oriented to person, place, and time. He appears well-developed.  HENT:  Mouth/Throat: Oropharynx is clear and moist.  Eyes: Conjunctivae are normal. Pupils are equal, round, and reactive to light.  Neck: Normal range of motion. No JVD present. No thyromegaly present.  Cardiovascular: Normal rate, regular rhythm, normal heart sounds and intact distal pulses.  Exam reveals no gallop and no friction rub.   No murmur heard. Pulmonary/Chest: Effort normal and breath  sounds normal. No respiratory distress. He has no wheezes. He has no rales. He exhibits no tenderness.  Abdominal: Soft. Bowel sounds are normal. He exhibits no distension and no mass. There is no tenderness. There is no rebound and no guarding.  Musculoskeletal: Normal range of motion. He exhibits tenderness (L parasp neck is tender). He exhibits no edema.  Lymphadenopathy:    He has no cervical adenopathy.  Neurological: He is alert and oriented to person, place, and time. He has normal reflexes. No cranial nerve deficit. He exhibits normal muscle tone. Coordination normal.  Skin: Skin is warm and dry. No rash noted.  Psychiatric: He has a normal mood and affect. His behavior is normal. Judgment and thought content normal.   Lab Results  Component Value Date   WBC 4.8 04/18/2011   HGB 15.7 04/18/2011   HCT 45.3 04/18/2011   PLT 223.0 04/18/2011   GLUCOSE 100* 04/18/2011   CHOL 200 04/18/2011   TRIG 87.0 04/18/2011   HDL 39.80 04/18/2011   LDLDIRECT 149.2 05/12/2009   LDLCALC 143* 04/18/2011   ALT 47 04/18/2011   AST 32 04/18/2011   NA 139 04/18/2011   K 4.2 04/18/2011   CL 103 04/18/2011   CREATININE 0.9 04/18/2011   BUN 11 04/18/2011   CO2 27 04/18/2011   TSH 1.14 04/18/2011   PSA 0.28 02/09/2009   HGBA1C 5.1 05/12/2009          Assessment & Plan:

## 2011-06-22 NOTE — Assessment & Plan Note (Signed)
MRI, PT, consult offered - declined at the moment Cont w/pain meds - Vicodin Stretch

## 2011-06-27 ENCOUNTER — Encounter: Payer: Self-pay | Admitting: Internal Medicine

## 2011-07-27 ENCOUNTER — Other Ambulatory Visit: Payer: Self-pay | Admitting: Internal Medicine

## 2011-07-27 ENCOUNTER — Telehealth: Payer: Self-pay | Admitting: *Deleted

## 2011-07-27 NOTE — Telephone Encounter (Signed)
OK to fill this prescription with additional refills x1 ROV Thank you!  

## 2011-07-27 NOTE — Telephone Encounter (Signed)
Requested Medications     HYDROcodone-acetaminophen (NORCO) 5-325 MG per tablet [Pharmacy Med Name: HYDROCODONE /ACETAMINOPHEN 5-325 TB]   TAKE 1 TABLET BY MOUTH TWICE DAILY AS NEEDED FOR PAIN   Disp: 60 tablet R: 0 Start: 07/27/2011  Class: Normal   Requested on: 07/27/2011   Last refill: 06/30/2011

## 2011-07-28 MED ORDER — HYDROCODONE-ACETAMINOPHEN 5-325 MG PO TABS: 1.0000 | ORAL_TABLET | Freq: Two times a day (BID) | ORAL | Status: AC | PRN

## 2011-07-28 NOTE — Telephone Encounter (Signed)
Done. Pt scheduled 10/2011

## 2011-10-06 ENCOUNTER — Other Ambulatory Visit: Payer: Self-pay | Admitting: Internal Medicine

## 2011-10-18 ENCOUNTER — Ambulatory Visit (INDEPENDENT_AMBULATORY_CARE_PROVIDER_SITE_OTHER): Payer: BC Managed Care – PPO | Admitting: Internal Medicine

## 2011-10-18 ENCOUNTER — Encounter: Payer: Self-pay | Admitting: Internal Medicine

## 2011-10-18 VITALS — BP 118/82 | HR 76 | Temp 98.1°F | Resp 16 | Wt 198.0 lb

## 2011-10-18 DIAGNOSIS — I10 Essential (primary) hypertension: Secondary | ICD-10-CM

## 2011-10-18 DIAGNOSIS — M26609 Unspecified temporomandibular joint disorder, unspecified side: Secondary | ICD-10-CM | POA: Insufficient documentation

## 2011-10-18 DIAGNOSIS — M542 Cervicalgia: Secondary | ICD-10-CM

## 2011-10-18 MED ORDER — HYDROCODONE-ACETAMINOPHEN 5-325 MG PO TABS: 1.0000 | ORAL_TABLET | Freq: Four times a day (QID) | ORAL | Status: DC | PRN

## 2011-10-18 NOTE — Assessment & Plan Note (Signed)
Worse in 2/13  L>R  MSK (B) Mild OA on x ray  Potential benefits of a long term opioids use as well as potential risks (i.e. addiction risk, apnea etc) and complications (i.e. Somnolence, constipation and others) were explained to the patient and were aknowledged. We will increase Hydroc/APAP to qid prn MRI/pain clinic/shots advised - he can't afford it now

## 2011-10-18 NOTE — Assessment & Plan Note (Signed)
Continue with current prescription therapy as reflected on the Med list. Diet discussed

## 2011-10-18 NOTE — Progress Notes (Signed)
  Subjective:    Patient ID: Dale Campbell, male    DOB: [birth year], 51 y.o.   MRN: [id number]  Neck Pain  Pertinent negatives include no chest pain, trouble swallowing or weakness.    The patient presents for a follow-up of  chronic hypertension, chronic dyslipidemia,rash in buttocks controlled better with medicines C/o neck pain and B shoulder pain 3-4/10 on meds lately x long time, worse in the past month; Vicodin helps  BP Readings from Last 3 Encounters:  10/18/11 118/82  06/22/11 118/80  04/22/11 140/88   Wt Readings from Last 3 Encounters:  10/18/11 198 lb (89.812 kg)  06/22/11 195 lb (88.451 kg)  04/22/11 198 lb (89.812 kg)        Review of Systems  Constitutional: Negative for appetite change, fatigue and unexpected weight change.  HENT: Positive for neck pain. Negative for nosebleeds, congestion, sore throat, sneezing and trouble swallowing.   Eyes: Negative for itching and visual disturbance.  Respiratory: Negative for cough.   Cardiovascular: Negative for chest pain, palpitations and leg swelling.  Gastrointestinal: Negative for nausea, diarrhea, blood in stool and abdominal distention.  Genitourinary: Negative for frequency and hematuria.  Musculoskeletal: Positive for back pain (neck). Negative for joint swelling and gait problem.  Skin: Positive for rash (better).  Neurological: Negative for dizziness, tremors, speech difficulty and weakness.  Psychiatric/Behavioral: Negative for disturbed wake/sleep cycle, dysphoric mood and agitation. The patient is not nervous/anxious.        Objective:   Physical Exam  Constitutional: He is oriented to person, place, and time. He appears well-developed.  HENT:  Mouth/Throat: Oropharynx is clear and moist.  Eyes: Conjunctivae are normal. Pupils are equal, round, and reactive to light.  Neck: Normal range of motion. No JVD present. No thyromegaly present.  Cardiovascular: Normal rate, regular rhythm, normal heart  sounds and intact distal pulses.  Exam reveals no gallop and no friction rub.   No murmur heard. Pulmonary/Chest: Effort normal and breath sounds normal. No respiratory distress. He has no wheezes. He has no rales. He exhibits no tenderness.  Abdominal: Soft. Bowel sounds are normal. He exhibits no distension and no mass. There is no tenderness. There is no rebound and no guarding.  Musculoskeletal: Normal range of motion. He exhibits tenderness (L parasp neck is tender). He exhibits no edema.  Lymphadenopathy:    He has no cervical adenopathy.  Neurological: He is alert and oriented to person, place, and time. He has normal reflexes. No cranial nerve deficit. He exhibits normal muscle tone. Coordination normal.  Skin: Skin is warm and dry. No rash noted.  Psychiatric: He has a normal mood and affect. His behavior is normal. Judgment and thought content normal.   Lab Results  Component Value Date   WBC 4.8 04/18/2011   HGB 15.7 04/18/2011   HCT 45.3 04/18/2011   PLT 223.0 04/18/2011   GLUCOSE 100* 04/18/2011   CHOL 200 04/18/2011   TRIG 87.0 04/18/2011   HDL 39.80 04/18/2011   LDLDIRECT 149.2 05/12/2009   LDLCALC 143* 04/18/2011   ALT 47 04/18/2011   AST 32 04/18/2011   NA 139 04/18/2011   K 4.2 04/18/2011   CL 103 04/18/2011   CREATININE 0.9 04/18/2011   BUN 11 04/18/2011   CO2 27 04/18/2011   TSH 1.14 04/18/2011   PSA 0.28 02/09/2009   HGBA1C 5.1 05/12/2009          Assessment & Plan:

## 2011-10-18 NOTE — Assessment & Plan Note (Signed)
Continue with current prescription therapy as reflected on the Med list.  

## 2011-12-06 ENCOUNTER — Other Ambulatory Visit: Payer: Self-pay | Admitting: Internal Medicine

## 2011-12-06 NOTE — Telephone Encounter (Signed)
Ok to Rf? 

## 2012-01-16 ENCOUNTER — Encounter: Payer: Self-pay | Admitting: Internal Medicine

## 2012-01-16 ENCOUNTER — Ambulatory Visit (INDEPENDENT_AMBULATORY_CARE_PROVIDER_SITE_OTHER): Payer: BC Managed Care – PPO | Admitting: Internal Medicine

## 2012-01-16 VITALS — BP 110/68 | HR 80 | Temp 98.1°F | Resp 16 | Wt 196.0 lb

## 2012-01-16 DIAGNOSIS — I10 Essential (primary) hypertension: Secondary | ICD-10-CM

## 2012-01-16 DIAGNOSIS — N529 Male erectile dysfunction, unspecified: Secondary | ICD-10-CM | POA: Insufficient documentation

## 2012-01-16 DIAGNOSIS — M542 Cervicalgia: Secondary | ICD-10-CM

## 2012-01-16 MED ORDER — SILDENAFIL CITRATE 100 MG PO TABS: 100.0000 mg | ORAL_TABLET | ORAL | Status: DC | PRN

## 2012-01-16 MED ORDER — HYDROCODONE-ACETAMINOPHEN 5-325 MG PO TABS: 1.0000 | ORAL_TABLET | Freq: Four times a day (QID) | ORAL | Status: DC | PRN

## 2012-01-16 NOTE — Assessment & Plan Note (Signed)
viagra prn 

## 2012-01-16 NOTE — Assessment & Plan Note (Signed)
Continue with current prescription therapy as reflected on the Med list.  

## 2012-01-16 NOTE — Progress Notes (Signed)
Patient ID: Dale Campbell, male   DOB: 09/28/1960, 51 y.o.   MRN: [id number]  Subjective:    Patient ID: Dale Campbell, male    DOB: 11-11-1960, 51 y.o.   MRN: [id number]  Neck Pain  Pertinent negatives include no chest pain, trouble swallowing or weakness.    The patient presents for a follow-up of  chronic hypertension, chronic dyslipidemia,rash in buttocks controlled better with medicines C/o neck pain and B shoulder pain 3-4/10 on meds lately x long time, worse in the past month; Vicodin helps C/o ED F/u HTN  BP Readings from Last 3 Encounters:  01/16/12 110/68  10/18/11 118/82  06/22/11 118/80   Wt Readings from Last 3 Encounters:  01/16/12 196 lb (88.905 kg)  10/18/11 198 lb (89.812 kg)  06/22/11 195 lb (88.451 kg)        Review of Systems  Constitutional: Negative for appetite change, fatigue and unexpected weight change.  HENT: Positive for neck pain. Negative for nosebleeds, congestion, sore throat, sneezing and trouble swallowing.   Eyes: Negative for itching and visual disturbance.  Respiratory: Negative for cough.   Cardiovascular: Negative for chest pain, palpitations and leg swelling.  Gastrointestinal: Negative for nausea, diarrhea, blood in stool and abdominal distention.  Genitourinary: Negative for frequency and hematuria.  Musculoskeletal: Positive for back pain (neck). Negative for joint swelling and gait problem.  Skin: Positive for rash (better).  Neurological: Negative for dizziness, tremors, speech difficulty and weakness.  Psychiatric/Behavioral: Negative for sleep disturbance, dysphoric mood and agitation. The patient is not nervous/anxious.        Objective:   Physical Exam  Constitutional: He is oriented to person, place, and time. He appears well-developed.  HENT:  Mouth/Throat: Oropharynx is clear and moist.  Eyes: Conjunctivae normal are normal. Pupils are equal, round, and reactive to light.  Neck: Normal range of motion. No JVD  present. No thyromegaly present.  Cardiovascular: Normal rate, regular rhythm, normal heart sounds and intact distal pulses.  Exam reveals no gallop and no friction rub.   No murmur heard. Pulmonary/Chest: Effort normal and breath sounds normal. No respiratory distress. He has no wheezes. He has no rales. He exhibits no tenderness.  Abdominal: Soft. Bowel sounds are normal. He exhibits no distension and no mass. There is no tenderness. There is no rebound and no guarding.  Musculoskeletal: Normal range of motion. He exhibits tenderness (L parasp neck is tender). He exhibits no edema.  Lymphadenopathy:    He has no cervical adenopathy.  Neurological: He is alert and oriented to person, place, and time. He has normal reflexes. No cranial nerve deficit. He exhibits normal muscle tone. Coordination normal.  Skin: Skin is warm and dry. No rash noted.  Psychiatric: He has a normal mood and affect. His behavior is normal. Judgment and thought content normal.   Lab Results  Component Value Date   WBC 4.8 04/18/2011   HGB 15.7 04/18/2011   HCT 45.3 04/18/2011   PLT 223.0 04/18/2011   GLUCOSE 100* 04/18/2011   CHOL 200 04/18/2011   TRIG 87.0 04/18/2011   HDL 39.80 04/18/2011   LDLDIRECT 149.2 05/12/2009   LDLCALC 143* 04/18/2011   ALT 47 04/18/2011   AST 32 04/18/2011   NA 139 04/18/2011   K 4.2 04/18/2011   CL 103 04/18/2011   CREATININE 0.9 04/18/2011   BUN 11 04/18/2011   CO2 27 04/18/2011   TSH 1.14 04/18/2011   PSA 0.28 02/09/2009   HGBA1C 5.1 05/12/2009  Assessment & Plan:

## 2012-02-22 ENCOUNTER — Ambulatory Visit (INDEPENDENT_AMBULATORY_CARE_PROVIDER_SITE_OTHER): Payer: BC Managed Care – PPO | Admitting: *Deleted

## 2012-02-22 DIAGNOSIS — Z23 Encounter for immunization: Secondary | ICD-10-CM

## 2012-02-29 ENCOUNTER — Other Ambulatory Visit: Payer: Self-pay | Admitting: Internal Medicine

## 2012-03-03 ENCOUNTER — Other Ambulatory Visit: Payer: Self-pay | Admitting: Internal Medicine

## 2012-03-05 ENCOUNTER — Telehealth: Payer: Self-pay | Admitting: Internal Medicine

## 2012-03-05 NOTE — Telephone Encounter (Signed)
The patient called the triage line hoping to get a refill of his medication.

## 2012-03-05 NOTE — Telephone Encounter (Signed)
Zolpidem Rf phoned in.  

## 2012-04-16 ENCOUNTER — Ambulatory Visit: Payer: BC Managed Care – PPO | Admitting: Internal Medicine

## 2012-05-07 ENCOUNTER — Encounter: Payer: Self-pay | Admitting: Internal Medicine

## 2012-05-07 ENCOUNTER — Ambulatory Visit (INDEPENDENT_AMBULATORY_CARE_PROVIDER_SITE_OTHER): Payer: BC Managed Care – PPO | Admitting: Internal Medicine

## 2012-05-07 VITALS — BP 140/80 | HR 80 | Temp 97.8°F | Resp 16 | Wt 187.0 lb

## 2012-05-07 DIAGNOSIS — G4700 Insomnia, unspecified: Secondary | ICD-10-CM

## 2012-05-07 DIAGNOSIS — I10 Essential (primary) hypertension: Secondary | ICD-10-CM

## 2012-05-07 DIAGNOSIS — M542 Cervicalgia: Secondary | ICD-10-CM

## 2012-05-07 DIAGNOSIS — N529 Male erectile dysfunction, unspecified: Secondary | ICD-10-CM

## 2012-05-07 MED ORDER — OMEPRAZOLE 20 MG PO CPDR: 20.0000 mg | DELAYED_RELEASE_CAPSULE | Freq: Every day | ORAL | Status: DC

## 2012-05-07 MED ORDER — ZOLPIDEM TARTRATE 10 MG PO TABS: 10.0000 mg | ORAL_TABLET | Freq: Every evening | ORAL | Status: DC | PRN

## 2012-05-07 MED ORDER — HYDROCODONE-ACETAMINOPHEN 5-325 MG PO TABS: 1.0000 | ORAL_TABLET | Freq: Four times a day (QID) | ORAL | Status: DC | PRN

## 2012-05-07 NOTE — Assessment & Plan Note (Signed)
Continue with current prescription therapy as reflected on the Med list.  

## 2012-05-07 NOTE — Assessment & Plan Note (Signed)
Continue with current prescription therapy as reflected on the Med list. Re-start stretching

## 2012-05-07 NOTE — Assessment & Plan Note (Signed)
He lost wt

## 2012-05-07 NOTE — Progress Notes (Signed)
  Subjective:    Patient ID: Dale Campbell, male    DOB: 28-May-1960, 52 y.o.   MRN: [id number]  HPI   The patient is here to follow up on chronic HTN, ED, insomnia and chronic moderate neck pain symptoms controlled with medicines, diet and exercise.  BP Readings from Last 3 Encounters:  05/07/12 140/80  01/16/12 110/68  10/18/11 118/82   Wt Readings from Last 3 Encounters:  05/07/12 187 lb (84.823 kg)  01/16/12 196 lb (88.905 kg)  10/18/11 198 lb (89.812 kg)      Review of Systems  Constitutional: Negative for appetite change, fatigue and unexpected weight change.  HENT: Negative for ear pain, nosebleeds, congestion, sore throat, sneezing, trouble swallowing and neck pain.   Eyes: Negative for itching and visual disturbance.  Respiratory: Negative for cough.   Cardiovascular: Negative for chest pain, palpitations and leg swelling.  Gastrointestinal: Negative for nausea, diarrhea, blood in stool and abdominal distention.  Genitourinary: Negative for frequency and hematuria.  Musculoskeletal: Negative for back pain, joint swelling and gait problem.       Neck pain  Skin: Negative for rash.  Neurological: Negative for dizziness, tremors, speech difficulty and weakness.  Psychiatric/Behavioral: Negative for sleep disturbance, dysphoric mood and agitation. The patient is not nervous/anxious.        Objective:   Physical Exam  Constitutional: He is oriented to person, place, and time. He appears well-developed and well-nourished. No distress.  HENT:  Mouth/Throat: Oropharynx is clear and moist.  Eyes: Conjunctivae are normal. Pupils are equal, round, and reactive to light.  Neck: Normal range of motion. No JVD present. No thyromegaly present.  Cardiovascular: Normal rate, regular rhythm, normal heart sounds and intact distal pulses.  Exam reveals no gallop and no friction rub.   No murmur heard. Pulmonary/Chest: Effort normal and breath sounds normal. No respiratory  distress. He has no wheezes. He has no rales. He exhibits no tenderness.  Abdominal: Soft. Bowel sounds are normal. He exhibits no distension and no mass. There is no tenderness. There is no rebound and no guarding.  Musculoskeletal: Normal range of motion. He exhibits tenderness (cerv spine is tender). He exhibits no edema.  Lymphadenopathy:    He has no cervical adenopathy.  Neurological: He is alert and oriented to person, place, and time. He has normal reflexes. No cranial nerve deficit. He exhibits normal muscle tone. Coordination normal.  Skin: Skin is warm and dry. No rash noted.  Psychiatric: He has a normal mood and affect. His behavior is normal. Judgment and thought content normal.   Lab Results  Component Value Date   WBC 4.8 04/18/2011   HGB 15.7 04/18/2011   HCT 45.3 04/18/2011   PLT 223.0 04/18/2011   GLUCOSE 100* 04/18/2011   CHOL 200 04/18/2011   TRIG 87.0 04/18/2011   HDL 39.80 04/18/2011   LDLDIRECT 149.2 05/12/2009   LDLCALC 143* 04/18/2011   ALT 47 04/18/2011   AST 32 04/18/2011   NA 139 04/18/2011   K 4.2 04/18/2011   CL 103 04/18/2011   CREATININE 0.9 04/18/2011   BUN 11 04/18/2011   CO2 27 04/18/2011   TSH 1.14 04/18/2011   PSA 0.28 02/09/2009   HGBA1C 5.1 05/12/2009          Assessment & Plan:

## 2012-05-07 NOTE — Progress Notes (Signed)
Patient ID: Dale Campbell, male   DOB: 08/14/1960, 52 y.o.   MRN: [id number]

## 2012-05-16 ENCOUNTER — Other Ambulatory Visit: Payer: Self-pay | Admitting: Internal Medicine

## 2012-08-08 ENCOUNTER — Ambulatory Visit: Payer: BC Managed Care – PPO | Admitting: Internal Medicine

## 2012-09-10 ENCOUNTER — Encounter: Payer: Self-pay | Admitting: Internal Medicine

## 2012-09-10 ENCOUNTER — Ambulatory Visit (INDEPENDENT_AMBULATORY_CARE_PROVIDER_SITE_OTHER): Payer: BC Managed Care – PPO | Admitting: Internal Medicine

## 2012-09-10 VITALS — BP 130/85 | HR 80 | Temp 97.4°F | Resp 16 | Wt 184.0 lb

## 2012-09-10 DIAGNOSIS — I10 Essential (primary) hypertension: Secondary | ICD-10-CM

## 2012-09-10 DIAGNOSIS — N529 Male erectile dysfunction, unspecified: Secondary | ICD-10-CM

## 2012-09-10 NOTE — Assessment & Plan Note (Signed)
Continue with current prn prescription therapy as reflected on the Med list.  

## 2012-09-10 NOTE — Assessment & Plan Note (Addendum)
6/14 off BP meds - nl BP (no alcohol consumption)

## 2012-09-10 NOTE — Progress Notes (Signed)
   Subjective:    HPI   The patient is here to follow up on chronic HTN (off Rx SBP at home 100-110), ED, insomnia and chronic moderate neck pain symptoms controlled with medicines, diet and exercise.  BP Readings from Last 3 Encounters:  09/10/12 138/88  05/07/12 140/80  01/16/12 110/68   Wt Readings from Last 3 Encounters:  09/10/12 184 lb (83.462 kg)  05/07/12 187 lb (84.823 kg)  01/16/12 196 lb (88.905 kg)      Review of Systems  Constitutional: Negative for appetite change, fatigue and unexpected weight change.  HENT: Negative for ear pain, nosebleeds, congestion, sore throat, sneezing, trouble swallowing and neck pain.   Eyes: Negative for itching and visual disturbance.  Respiratory: Negative for cough.   Cardiovascular: Negative for chest pain, palpitations and leg swelling.  Gastrointestinal: Negative for nausea, diarrhea, blood in stool and abdominal distention.  Genitourinary: Negative for frequency and hematuria.  Musculoskeletal: Negative for back pain, joint swelling and gait problem.       Neck pain  Skin: Negative for rash.  Neurological: Negative for dizziness, tremors, speech difficulty and weakness.  Psychiatric/Behavioral: Negative for sleep disturbance, dysphoric mood and agitation. The patient is not nervous/anxious.        Objective:   Physical Exam  Constitutional: He is oriented to person, place, and time. He appears well-developed and well-nourished. No distress.  HENT:  Mouth/Throat: Oropharynx is clear and moist.  Eyes: Conjunctivae are normal. Pupils are equal, round, and reactive to light.  Neck: Normal range of motion. No JVD present. No thyromegaly present.  Cardiovascular: Normal rate, regular rhythm, normal heart sounds and intact distal pulses.  Exam reveals no gallop and no friction rub.   No murmur heard. Pulmonary/Chest: Effort normal and breath sounds normal. No respiratory distress. He has no wheezes. He has no rales. He exhibits  no tenderness.  Abdominal: Soft. Bowel sounds are normal. He exhibits no distension and no mass. There is no tenderness. There is no rebound and no guarding.  Musculoskeletal: Normal range of motion. He exhibits tenderness (cerv spine is tender). He exhibits no edema.  Lymphadenopathy:    He has no cervical adenopathy.  Neurological: He is alert and oriented to person, place, and time. He has normal reflexes. No cranial nerve deficit. He exhibits normal muscle tone. Coordination normal.  Skin: Skin is warm and dry. No rash noted.  Psychiatric: He has a normal mood and affect. His behavior is normal. Judgment and thought content normal.   Lab Results  Component Value Date   WBC 4.8 04/18/2011   HGB 15.7 04/18/2011   HCT 45.3 04/18/2011   PLT 223.0 04/18/2011   GLUCOSE 100* 04/18/2011   CHOL 200 04/18/2011   TRIG 87.0 04/18/2011   HDL 39.80 04/18/2011   LDLDIRECT 149.2 05/12/2009   LDLCALC 143* 04/18/2011   ALT 47 04/18/2011   AST 32 04/18/2011   NA 139 04/18/2011   K 4.2 04/18/2011   CL 103 04/18/2011   CREATININE 0.9 04/18/2011   BUN 11 04/18/2011   CO2 27 04/18/2011   TSH 1.14 04/18/2011   PSA 0.28 02/09/2009   HGBA1C 5.1 05/12/2009          Assessment & Plan:

## 2012-09-11 ENCOUNTER — Encounter: Payer: Self-pay | Admitting: Internal Medicine

## 2012-10-18 ENCOUNTER — Telehealth: Payer: Self-pay | Admitting: *Deleted

## 2012-10-18 DIAGNOSIS — Z0000 Encounter for general adult medical examination without abnormal findings: Secondary | ICD-10-CM

## 2012-10-18 DIAGNOSIS — Z0389 Encounter for observation for other suspected diseases and conditions ruled out: Secondary | ICD-10-CM

## 2012-10-18 NOTE — Telephone Encounter (Signed)
Message copied by Merrilyn Puma on Thu Oct 18, 2012 10:06 AM ------      Message from: Etheleen Sia      Created: Mon Sep 10, 2012  3:38 PM      Regarding: LABS       PHYSICAL LABS FOR DEC ------

## 2012-10-18 NOTE — Telephone Encounter (Signed)
CPE labs entered.  

## 2012-11-07 ENCOUNTER — Other Ambulatory Visit: Payer: Self-pay | Admitting: Internal Medicine

## 2012-11-13 ENCOUNTER — Telehealth: Payer: Self-pay | Admitting: *Deleted

## 2012-11-13 NOTE — Telephone Encounter (Signed)
OK to fill this prescription with additional refills x2 Thank you!  

## 2012-11-13 NOTE — Telephone Encounter (Signed)
Pt called requesting Ambien refill.  Last OV 6.30.2014.  Please advise

## 2012-11-14 MED ORDER — ZOLPIDEM TARTRATE 10 MG PO TABS: 10.0000 mg | ORAL_TABLET | Freq: Every evening | ORAL | Status: DC | PRN

## 2012-11-14 NOTE — Telephone Encounter (Signed)
rx called in, pt notified

## 2012-11-22 ENCOUNTER — Other Ambulatory Visit: Payer: Self-pay | Admitting: Internal Medicine

## 2013-01-11 ENCOUNTER — Telehealth: Payer: Self-pay | Admitting: Internal Medicine

## 2013-01-11 NOTE — Telephone Encounter (Signed)
01/11/2013  Pt left message requesting refill on RX : HYDROcodone-acetaminophen (NORCO/VICODIN) 5-325 MG per tablet

## 2013-01-11 NOTE — Telephone Encounter (Signed)
OK to fill this prescription with additional refills x0. Sch OV q 3 mo Thank you! 

## 2013-01-14 MED ORDER — HYDROCODONE-ACETAMINOPHEN 5-325 MG PO TABS: 1.0000 | ORAL_TABLET | Freq: Four times a day (QID) | ORAL | Status: DC | PRN

## 2013-01-14 NOTE — Telephone Encounter (Signed)
Pt informed of below and rx is ready for p/u.

## 2013-02-22 ENCOUNTER — Other Ambulatory Visit (INDEPENDENT_AMBULATORY_CARE_PROVIDER_SITE_OTHER): Payer: BC Managed Care – PPO

## 2013-02-22 DIAGNOSIS — Z0000 Encounter for general adult medical examination without abnormal findings: Secondary | ICD-10-CM

## 2013-02-22 DIAGNOSIS — Z0389 Encounter for observation for other suspected diseases and conditions ruled out: Secondary | ICD-10-CM

## 2013-02-22 LAB — BASIC METABOLIC PANEL
BUN: 9 mg/dL (ref 6–23)
Calcium: 9.1 mg/dL (ref 8.4–10.5)
GFR: 74.7 mL/min (ref >60.00)
Glucose, Bld: 102 mg/dL — ABNORMAL HIGH (ref 70–99)

## 2013-02-22 LAB — PSA: PSA: 0.32 ng/mL (ref 0.10–4.00)

## 2013-02-22 LAB — URINALYSIS, ROUTINE W REFLEX MICROSCOPIC
Nitrite: NEGATIVE
Specific Gravity, Urine: 1.025 (ref 1.000–1.030)

## 2013-02-22 LAB — CBC WITH DIFFERENTIAL/PLATELET
Basophils Absolute: 0 10*3/uL (ref 0.0–0.1)
Basophils Relative: 0.6 % (ref 0.0–3.0)
Eosinophils Absolute: 0.1 10*3/uL (ref 0.0–0.7)
Lymphocytes Relative: 32.9 % (ref 12.0–46.0)
MCHC: 34.5 g/dL (ref 30.0–36.0)
Neutrophils Relative %: 53.4 % (ref 43.0–77.0)
RBC: 4.84 Mil/uL (ref 4.22–5.81)
RDW: 12.3 % (ref 11.5–14.6)

## 2013-02-22 LAB — TSH: TSH: 1.07 u[IU]/mL (ref 0.35–5.50)

## 2013-02-22 LAB — LIPID PANEL
HDL: 43.7 mg/dL (ref >39.00)
Triglycerides: 134 mg/dL (ref 0.0–149.0)

## 2013-02-22 LAB — LDL CHOLESTEROL, DIRECT: Direct LDL: 151 mg/dL

## 2013-02-22 LAB — HEPATIC FUNCTION PANEL: Albumin: 4.3 g/dL (ref 3.5–5.2)

## 2013-02-26 ENCOUNTER — Ambulatory Visit (INDEPENDENT_AMBULATORY_CARE_PROVIDER_SITE_OTHER): Payer: BC Managed Care – PPO | Admitting: Internal Medicine

## 2013-02-26 ENCOUNTER — Encounter: Payer: Self-pay | Admitting: Internal Medicine

## 2013-02-26 VITALS — BP 150/80 | HR 80 | Temp 98.2°F | Resp 16 | Ht 72.0 in | Wt 188.0 lb

## 2013-02-26 DIAGNOSIS — Z23 Encounter for immunization: Secondary | ICD-10-CM

## 2013-02-26 DIAGNOSIS — Z1211 Encounter for screening for malignant neoplasm of colon: Secondary | ICD-10-CM

## 2013-02-26 DIAGNOSIS — M542 Cervicalgia: Secondary | ICD-10-CM

## 2013-02-26 DIAGNOSIS — E785 Hyperlipidemia, unspecified: Secondary | ICD-10-CM | POA: Insufficient documentation

## 2013-02-26 DIAGNOSIS — I10 Essential (primary) hypertension: Secondary | ICD-10-CM

## 2013-02-26 DIAGNOSIS — Z0000 Encounter for general adult medical examination without abnormal findings: Secondary | ICD-10-CM

## 2013-02-26 MED ORDER — PRAVASTATIN SODIUM 20 MG PO TABS: 20.0000 mg | ORAL_TABLET | Freq: Every day | ORAL | Status: DC

## 2013-02-26 MED ORDER — HYDROCODONE-ACETAMINOPHEN 5-325 MG PO TABS: 1.0000 | ORAL_TABLET | Freq: Four times a day (QID) | ORAL | Status: DC | PRN

## 2013-02-26 NOTE — Assessment & Plan Note (Signed)
Continue with current prescription therapy as reflected on the Med list.  

## 2013-02-26 NOTE — Progress Notes (Signed)
Subjective:    HPI  The patient is here for a wellness exam. The patient has been doing well overall without major physical or psychological issues going on lately C/o R ear being stopped up The patient is here to follow up on chronic HTN (off Rx SBP at home 140), ED, insomnia and chronic moderate neck pain symptoms controlled with medicines, diet and exercise.  BP Readings from Last 3 Encounters:  02/26/13 150/80  09/10/12 130/85  05/07/12 140/80   Wt Readings from Last 3 Encounters:  02/26/13 188 lb (85.276 kg)  09/10/12 184 lb (83.462 kg)  05/07/12 187 lb (84.823 kg)      Review of Systems  Constitutional: Negative for appetite change, fatigue and unexpected weight change.  HENT: Negative for congestion, ear pain, nosebleeds, sneezing, sore throat and trouble swallowing.   Eyes: Negative for itching and visual disturbance.  Respiratory: Negative for cough.   Cardiovascular: Negative for chest pain, palpitations and leg swelling.  Gastrointestinal: Negative for nausea, diarrhea, blood in stool and abdominal distention.  Endocrine: Negative for polydipsia and polyuria.  Genitourinary: Negative for dysuria, urgency, frequency, hematuria, flank pain and difficulty urinating.  Musculoskeletal: Negative for arthralgias, back pain, gait problem, joint swelling and neck pain.       Neck pain  Skin: Negative for rash.  Neurological: Negative for dizziness, tremors, syncope, speech difficulty and weakness.  Psychiatric/Behavioral: Negative for suicidal ideas, sleep disturbance, dysphoric mood and agitation. The patient is not nervous/anxious.        Objective:   Physical Exam  Constitutional: He is oriented to person, place, and time. He appears well-developed and well-nourished. No distress.  HENT:  Mouth/Throat: Oropharynx is clear and moist.  Eyes: Conjunctivae are normal. Pupils are equal, round, and reactive to light.  Neck: Normal range of motion. No JVD present. No  thyromegaly present.  Cardiovascular: Normal rate, regular rhythm, normal heart sounds and intact distal pulses.  Exam reveals no gallop and no friction rub.   No murmur heard. Pulmonary/Chest: Effort normal and breath sounds normal. No respiratory distress. He has no wheezes. He has no rales. He exhibits no tenderness.  Abdominal: Soft. Bowel sounds are normal. He exhibits no distension and no mass. There is no tenderness. There is no rebound and no guarding.  Genitourinary:  Pt deferred rectal exam to GI  Musculoskeletal: Normal range of motion. He exhibits tenderness (cerv spine is tender). He exhibits no edema.  Lymphadenopathy:    He has no cervical adenopathy.  Neurological: He is alert and oriented to person, place, and time. He has normal reflexes. No cranial nerve deficit. He exhibits normal muscle tone. Coordination normal.  Skin: Skin is warm and dry. No rash noted.  Psychiatric: He has a normal mood and affect. His behavior is normal. Judgment and thought content normal.   Lab Results  Component Value Date   WBC 4.4* 02/22/2013   HGB 15.5 02/22/2013   HCT 44.9 02/22/2013   PLT 221.0 02/22/2013   GLUCOSE 102* 02/22/2013   CHOL 210* 02/22/2013   TRIG 134.0 02/22/2013   HDL 43.70 02/22/2013   LDLDIRECT 151.0 02/22/2013   LDLCALC 143* 04/18/2011   ALT 22 02/22/2013   AST 20 02/22/2013   NA 138 02/22/2013   K 3.9 02/22/2013   CL 104 02/22/2013   CREATININE 1.1 02/22/2013   BUN 9 02/22/2013   CO2 29 02/22/2013   TSH 1.07 02/22/2013   PSA 0.32 02/22/2013   HGBA1C 5.1 05/12/2009    EKG  is ok      Assessment & Plan:

## 2013-02-26 NOTE — Assessment & Plan Note (Signed)
Start Rx  RTC 3-4 mo w/labs

## 2013-02-26 NOTE — Assessment & Plan Note (Signed)
We discussed age appropriate health related issues, including available/recomended screening tests and vaccinations. We discussed a need for adhering to healthy diet and exercise. Labs/EKG were reviewed/ordered. All questions were answered. Schedule colonoscopy Flu shot

## 2013-02-26 NOTE — Progress Notes (Signed)
Pre visit review using our clinic review tool, if applicable. No additional management support is needed unless otherwise documented below in the visit note. 

## 2013-02-26 NOTE — Patient Instructions (Signed)
BP Readings from Last 3 Encounters:  02/26/13 150/80  09/10/12 130/85  05/07/12 140/80   Wt Readings from Last 3 Encounters:  02/26/13 188 lb (85.276 kg)  09/10/12 184 lb (83.462 kg)  05/07/12 187 lb (84.823 kg)

## 2013-02-28 ENCOUNTER — Encounter: Payer: Self-pay | Admitting: Internal Medicine

## 2013-03-05 IMAGING — CR DG NECK SOFT TISSUE
1 series · 1 of 1 positions shown · non-contrast
Comparison: None.

CLINICAL DATA: Dull left-sided neck pain for 1 month

NECK SOFT TISSUES - 1+ VIEW

[view not recorded]
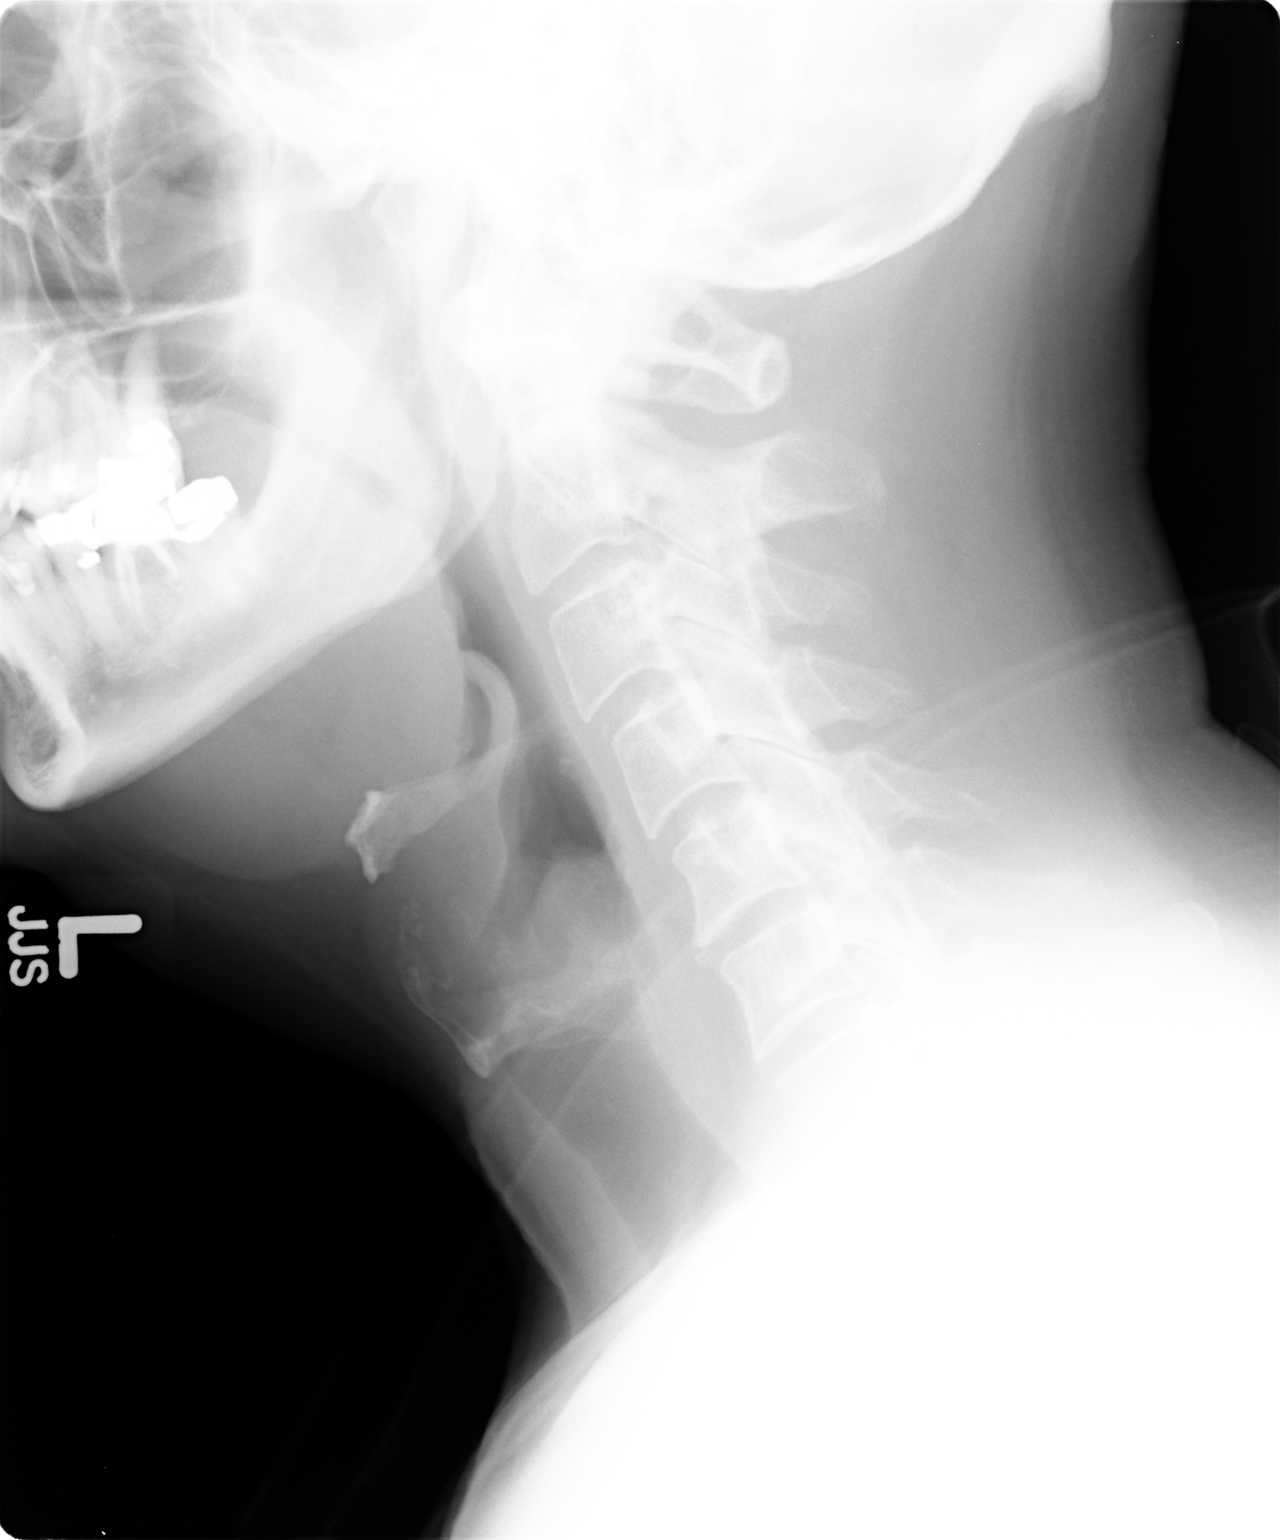

[1 of 1 positions shown; findings below may reference images not displayed]

FINDINGS: A soft tissue lateral view of the neck shows the cervical
vertebrae to be in normal alignment.  Only minimal degenerative
change is noted at the C5-6 level.  No prevertebral soft tissue
swelling is seen.  The hypopharyngeal airway appears normal.  The
epiglottis is normal in size.  No opaque foreign body is seen.
IMPRESSION: 1.  Straightened alignment of the cervical vertebrae with mild
degenerative disc disease at C5-6.
2.  The epiglottis and airway appear normal.  No opaque foreign
body.

## 2013-03-12 ENCOUNTER — Ambulatory Visit (AMBULATORY_SURGERY_CENTER): Payer: Self-pay | Admitting: *Deleted

## 2013-03-12 VITALS — Ht 72.0 in | Wt 183.0 lb

## 2013-03-12 DIAGNOSIS — Z1211 Encounter for screening for malignant neoplasm of colon: Secondary | ICD-10-CM

## 2013-03-12 MED ORDER — NA SULFATE-K SULFATE-MG SULF 17.5-3.13-1.6 GM/177ML PO SOLN: 1.0000 | Freq: Once | ORAL | Status: DC

## 2013-03-12 NOTE — Progress Notes (Signed)
No allergies to eggs or soy. No problems with anesthesia.  

## 2013-03-14 HISTORY — PX: COLONOSCOPY: SHX174

## 2013-04-01 ENCOUNTER — Encounter: Payer: Self-pay | Admitting: Internal Medicine

## 2013-04-01 ENCOUNTER — Ambulatory Visit (AMBULATORY_SURGERY_CENTER): Payer: BC Managed Care – PPO | Admitting: Internal Medicine

## 2013-04-01 VITALS — BP 143/87 | HR 70 | Temp 98.2°F | Resp 26 | Ht 72.0 in | Wt 183.0 lb

## 2013-04-01 DIAGNOSIS — D126 Benign neoplasm of colon, unspecified: Secondary | ICD-10-CM

## 2013-04-01 DIAGNOSIS — K648 Other hemorrhoids: Secondary | ICD-10-CM

## 2013-04-01 DIAGNOSIS — Z860100 Personal history of colon polyps, unspecified: Secondary | ICD-10-CM | POA: Insufficient documentation

## 2013-04-01 DIAGNOSIS — Z1211 Encounter for screening for malignant neoplasm of colon: Secondary | ICD-10-CM

## 2013-04-01 HISTORY — DX: Personal history of colon polyps, unspecified: Z86.0100

## 2013-04-01 HISTORY — DX: Personal history of colonic polyps: Z86.010

## 2013-04-01 MED ORDER — SODIUM CHLORIDE 0.9 % IV SOLN: 500.0000 mL | INTRAVENOUS | Status: DC

## 2013-04-01 NOTE — Progress Notes (Signed)
Called to room to assist during endoscopic procedure.  Patient ID and intended procedure confirmed with present staff. Received instructions for my participation in the procedure from the performing physician.  

## 2013-04-01 NOTE — Progress Notes (Signed)
No complaints noted in the recovery room. Maw   

## 2013-04-01 NOTE — Patient Instructions (Addendum)
I found and removed 4 polyps today that look benign.   You also have internal hemorrhoids.  Everything else looked ok.  I will let you know pathology results and when to have another routine colonoscopy by mail.  If you have hemorrhoid problems (swelling, itching, bleeding) I am able to treat those with an in-office procedure. If you like, please call my office at [phone] to schedule an appointment and I can evaluate you further.  I appreciate the opportunity to care for you. Gatha Mayer, MD, FACG     YOU HAD AN ENDOSCOPIC PROCEDURE TODAY AT [facility]: Refer to the procedure report that was given to you for any specific questions about what was found during the examination.  If the procedure report does not answer your questions, please call your gastroenterologist to clarify.  If you requested that your care partner not be given the details of your procedure findings, then the procedure report has been included in a sealed envelope for you to review at your convenience later.  YOU SHOULD EXPECT: Some feelings of bloating in the abdomen. Passage of more gas than usual.  Walking can help get rid of the air that was put into your GI tract during the procedure and reduce the bloating. If you had a lower endoscopy (such as a colonoscopy or flexible sigmoidoscopy) you may notice spotting of blood in your stool or on the toilet paper. If you underwent a bowel prep for your procedure, then you may not have a normal bowel movement for a few days.  DIET: Your first meal following the procedure should be a light meal and then it is ok to progress to your normal diet.  A half-sandwich or bowl of soup is an example of a good first meal.  Heavy or fried foods are harder to digest and may make you feel nauseous or bloated.  Likewise meals heavy in dairy and vegetables can cause extra gas to form and this can also increase the bloating.  Drink plenty of fluids but you should avoid  alcoholic beverages for 24 hours.  ACTIVITY: Your care partner should take you home directly after the procedure.  You should plan to take it easy, moving slowly for the rest of the day.  You can resume normal activity the day after the procedure however you should NOT DRIVE or use heavy machinery for 24 hours (because of the sedation medicines used during the test).    SYMPTOMS TO REPORT IMMEDIATELY: A gastroenterologist can be reached at any hour.  During normal business hours, 8:30 AM to 5:00 PM Monday through Friday, call [phone].  After hours and on weekends, please call the GI answering service at [phone] who will take a message and have the physician on call contact you.   Following lower endoscopy (colonoscopy or flexible sigmoidoscopy):  Excessive amounts of blood in the stool  Significant tenderness or worsening of abdominal pains  Swelling of the abdomen that is new, acute  Fever of 100F or higher   FOLLOW UP: If any biopsies were taken you will be contacted by phone or by letter within the next 1-3 weeks.  Call your gastroenterologist if you have not heard about the biopsies in 3 weeks.  Our staff will call the home number listed on your records the next business day following your procedure to check on you and address any questions or concerns that you may have at that time regarding the information given to you  following your procedure. This is a courtesy call and so if there is no answer at the home number and we have not heard from you through the emergency physician on call, we will assume that you have returned to your regular daily activities without incident.  SIGNATURES/CONFIDENTIALITY: You and/or your care partner have signed paperwork which will be entered into your electronic medical record.  These signatures attest to the fact that that the information above on your After Visit Summary has been reviewed and is understood.  Full responsibility of the  confidentiality of this discharge information lies with you and/or your care-partner.   Please hold any aspirin, aspirin products and any anti-inflammatory products for 2 weeks.  You may resume your other current medications today. Handouts were given to your friend on hemorrhoids, [place] and polyps. Please call if any questions or problems.

## 2013-04-01 NOTE — Op Note (Signed)
[place]  [address] [place], [address]   COLONOSCOPY PROCEDURE REPORT  PATIENT: Dale Campbell, Dale Campbell  MR#: [id number] BIRTHDATE: 05-18-1960 , 52  yrs. old GENDER: Male ENDOSCOPIST: Gatha Mayer, MD, [facility] REFERRED UR:[facility] Avel Sensor, M.D. PROCEDURE DATE:  04/01/2013 PROCEDURE:   Colonoscopy with snare polypectomy First Screening Colonoscopy - Avg.  risk and is 50 yrs.  old or older Yes.  Prior Negative Screening - Now for repeat screening. N/A  History of Adenoma - Now for follow-up colonoscopy & has been > or = to 3 yrs.  N/A  Polyps Removed Today? Yes. ASA CLASS:   Class II INDICATIONS:average risk screening and first colonoscopy. MEDICATIONS: propofol (Diprivan) 350mg  IV, MAC sedation, administered by CRNA, and These medications were titrated to patient response per physician's verbal order  DESCRIPTION OF PROCEDURE:   After the risks benefits and alternatives of the procedure were thoroughly explained, informed consent was obtained.  A digital rectal exam revealed no abnormalities of the rectum, A digital rectal exam revealed no prostatic nodules, and A digital rectal exam revealed the prostate was not enlarged.   The LB [id number]  endoscope was introduced through the anus and advanced to the cecum, which was identified by both the appendix and ileocecal valve. No adverse events experienced.   The quality of the prep was excellent using Suprep  The instrument was then slowly withdrawn as the colon was fully examined.   COLON FINDINGS: Four sessile polyps measuring 4 (sigmoid), 8 (rectum), 8 and 10 (ascending) mm in size were found in the ascending colon, sigmoid colon, and rectum.  A polypectomy was performed with a cold snare (sigmoid) and using snare cautery (others).  The resection was complete and the polyp tissue was completely retrieved.   The colon mucosa was otherwise normal.   A right colon retroflexion was performed.  Retroflexed  views revealed internal hemorrhoids. The time to cecum=2 minutes 26 seconds. Withdrawal time=14 minutes 10 seconds.  The scope was withdrawn and the procedure completed. COMPLICATIONS: There were no complications.  ENDOSCOPIC IMPRESSION: 1.   Four sessile polyps measuring 4, 8, 8 and 10 mm in size were found in the ascending colon, sigmoid colon, and rectum; polypectomy was performed with a cold snare and using snare cautery  2.   The colon mucosa was otherwise normal - excellent prep - first colonoscopy 3.   Internal hemorrhoids in the rectum  RECOMMENDATIONS: 1.  Hold aspirin, aspirin products, and anti-inflammatory medication for 2 weeks. 2.  Timing of repeat colonoscopy will be determined by pathology findings.  eSigned:  Gatha Mayer, MD, [facility] 04/01/2013 3:27 PM cc: Altamese [place].  Plotnikov, MD and The Patient

## 2013-04-02 ENCOUNTER — Telehealth: Payer: Self-pay | Admitting: *Deleted

## 2013-04-02 NOTE — Telephone Encounter (Signed)
  Follow up Call-  Call back number 04/01/2013  Post procedure Call Back phone  # [phone]  Permission to leave phone message Yes     Patient questions:  Do you have a fever, pain , or abdominal swelling? no Pain Score  0 *  Have you tolerated food without any problems? yes  Have you been able to return to your normal activities? yes  Do you have any questions about your discharge instructions: Diet   no Medications  no Follow up visit  no  Do you have questions or concerns about your Care? no  Actions: * If pain score is 4 or above: No action needed, pain <4.

## 2013-04-04 ENCOUNTER — Encounter: Payer: Self-pay | Admitting: Internal Medicine

## 2013-04-04 NOTE — Progress Notes (Signed)
Quick Note:  2 adenomas max 8 mm repeat colon 2020 ______

## 2013-05-06 ENCOUNTER — Ambulatory Visit (INDEPENDENT_AMBULATORY_CARE_PROVIDER_SITE_OTHER): Payer: BC Managed Care – PPO | Admitting: Internal Medicine

## 2013-05-06 ENCOUNTER — Encounter: Payer: Self-pay | Admitting: Internal Medicine

## 2013-05-06 VITALS — BP 110/76 | HR 88 | Ht 71.0 in | Wt 187.5 lb

## 2013-05-06 DIAGNOSIS — K648 Other hemorrhoids: Secondary | ICD-10-CM

## 2013-05-06 HISTORY — DX: Other hemorrhoids: K64.8

## 2013-05-06 NOTE — Progress Notes (Signed)
Patient ID: Dale Campbell, male   DOB: [birth year], 53 y.o.   MRN: [id number]        Irregular defecation pattern, intermitting itching, irritation and rectal bleeding x 15 years. Denies excessive time on commode or straining to stool. Can sense prolapse with wiping. Spontaneous resolution.  Identified at colonoscopy 03/2013. All 3 columns, RP most prominent.   PROCEDURE NOTE: The patient presents with symptomatic grade 2 hemorrhoids, requesting rubber band ligation of his/her hemorrhoidal disease.  All risks, benefits and alternative forms of therapy were described and informed consent was obtained.    The decision was made to band the RP internal hemorrhoid, and the Sidney was used to perform band ligation without complication.  Digital anorectal examination was then performed to assure proper positioning of the band, and to adjust the banded tissue as required.  The patient was discharged home without pain or other issues.  Dietary and behavioral recommendations were given and along with follow-up instructions.     The following adjunctive treatments were recommended:  Benefiber  The patient will return in 2-3 weeks for  follow-up and possible additional banding as required. No complications were encountered and the patient tolerated the procedure well.

## 2013-05-06 NOTE — Patient Instructions (Addendum)
HEMORRHOID BANDING PROCEDURE    FOLLOW-UP CARE   1. The procedure you have had should have been relatively painless since the banding of the area involved does not have nerve endings and there is no pain sensation.  The rubber band cuts off the blood supply to the hemorrhoid and the band may fall off as soon as 48 hours after the banding (the band may occasionally be seen in the toilet bowl following a bowel movement). You may notice a temporary feeling of fullness in the rectum which should respond adequately to plain Tylenol or Motrin.  2. Following the banding, avoid strenuous exercise that evening and resume full activity the next day.  A sitz bath (soaking in a warm tub) or bidet is soothing, and can be useful for cleansing the area after bowel movements.     3. To avoid constipation, take two tablespoons of natural wheat bran, natural oat bran, flax, Benefiber or any over the counter fiber supplement and increase your water intake to 7-8 glasses daily.    4. Unless you have been prescribed anorectal medication, do not put anything inside your rectum for two weeks: No suppositories, enemas, fingers, etc.  5. Occasionally, you may have more bleeding than usual after the banding procedure.  This is often from the untreated hemorrhoids rather than the treated one.  Don't be concerned if there is a tablespoon or so of blood.  If there is more blood than this, lie flat with your bottom higher than your head and apply an ice pack to the area. If the bleeding does not stop within a half an hour or if you feel faint, call our office at [phone] or go to the emergency room.  6. Problems are not common; however, if there is a substantial amount of bleeding, severe pain, chills, fever or difficulty passing urine (very rare) or other problems, you should call us at [phone] or report to the nearest emergency room.  7. Do not stay seated continuously for more than 2-3 hours for a day or two  after the procedure.  Tighten your buttock muscles 10-15 times every two hours and take 10-15 deep breaths every 1-2 hours.  Do not spend more than a few minutes on the toilet if you cannot empty your bowel; instead re-visit the toilet at a later time.    Today we are giving you a benefiber sheet to read and follow.   We will see you at your next appointment in 2-3 weeks.  Call back after checking your schedule and we will put you on the books.    I appreciate the opportunity to care for you.

## 2013-05-06 NOTE — Assessment & Plan Note (Addendum)
RP banded Start Benefiber RTC 2-3 weeks - repeat banding

## 2013-05-10 ENCOUNTER — Telehealth: Payer: Self-pay | Admitting: *Deleted

## 2013-05-10 NOTE — Telephone Encounter (Signed)
OK to fill this prescription with additional refills x0 See Benna Dunks Thank you!

## 2013-05-10 NOTE — Telephone Encounter (Signed)
Patient phoned requesting refill on hydrocodone.  Last OV with PCP 03/29/12 and med last ordered 02/26/13.  CB# [phone]

## 2013-05-13 ENCOUNTER — Other Ambulatory Visit: Payer: Self-pay | Admitting: *Deleted

## 2013-05-13 MED ORDER — HYDROCODONE-ACETAMINOPHEN 5-325 MG PO TABS: 1.0000 | ORAL_TABLET | Freq: Four times a day (QID) | ORAL | Status: DC | PRN

## 2013-05-13 NOTE — Telephone Encounter (Signed)
Pt informed Rx ready for p/u and about controlled substance contract.

## 2013-05-13 NOTE — Telephone Encounter (Signed)
Have you already notified patient, or do I need to?

## 2013-05-20 ENCOUNTER — Other Ambulatory Visit: Payer: Self-pay | Admitting: Internal Medicine

## 2013-05-27 ENCOUNTER — Encounter: Payer: Self-pay | Admitting: Internal Medicine

## 2013-05-27 ENCOUNTER — Ambulatory Visit (INDEPENDENT_AMBULATORY_CARE_PROVIDER_SITE_OTHER): Payer: BC Managed Care – PPO | Admitting: Internal Medicine

## 2013-05-27 VITALS — BP 140/85 | HR 80 | Temp 97.3°F | Resp 16 | Wt 188.0 lb

## 2013-05-27 DIAGNOSIS — I10 Essential (primary) hypertension: Secondary | ICD-10-CM

## 2013-05-27 DIAGNOSIS — M542 Cervicalgia: Secondary | ICD-10-CM

## 2013-05-27 DIAGNOSIS — G4700 Insomnia, unspecified: Secondary | ICD-10-CM

## 2013-05-27 MED ORDER — TELMISARTAN 40 MG PO TABS: 40.0000 mg | ORAL_TABLET | Freq: Every day | ORAL | Status: DC

## 2013-05-27 NOTE — Progress Notes (Signed)
Pre visit review using our clinic review tool, if applicable. No additional management support is needed unless otherwise documented below in the visit note. 

## 2013-05-27 NOTE — Assessment & Plan Note (Signed)
Continue with current prescription therapy as reflected on the Med list. He stopped using THC

## 2013-05-27 NOTE — Assessment & Plan Note (Addendum)
See Rx 

## 2013-05-27 NOTE — Assessment & Plan Note (Signed)
Continue with current prescription therapy as reflected on the Med list.  

## 2013-05-27 NOTE — Progress Notes (Signed)
   Subjective:    HPI   The patient is here to follow up on chronic HTN (off Rx SBP at home 140), ED, insomnia and chronic moderate neck pain symptoms controlled with medicines, diet and exercise.  BP Readings from Last 3 Encounters:  05/27/13 150/94  05/06/13 110/76  04/01/13 143/87   Wt Readings from Last 3 Encounters:  05/27/13 188 lb (85.276 kg)  05/06/13 187 lb 8 oz (85.049 kg)  04/01/13 183 lb (83.008 kg)      Review of Systems  Constitutional: Negative for appetite change, fatigue and unexpected weight change.  HENT: Negative for congestion, ear pain, nosebleeds, sneezing, sore throat and trouble swallowing.   Eyes: Negative for itching and visual disturbance.  Respiratory: Negative for cough.   Cardiovascular: Negative for chest pain, palpitations and leg swelling.  Gastrointestinal: Negative for nausea, diarrhea, blood in stool and abdominal distention.  Endocrine: Negative for polydipsia and polyuria.  Genitourinary: Negative for dysuria, urgency, frequency, hematuria, flank pain and difficulty urinating.  Musculoskeletal: Negative for arthralgias, back pain, gait problem, joint swelling and neck pain.       Neck pain  Skin: Negative for rash.  Neurological: Negative for dizziness, tremors, syncope, speech difficulty and weakness.  Psychiatric/Behavioral: Negative for suicidal ideas, sleep disturbance, dysphoric mood and agitation. The patient is not nervous/anxious.        Objective:   Physical Exam  Constitutional: He is oriented to person, place, and time. He appears well-developed and well-nourished. No distress.  HENT:  Mouth/Throat: Oropharynx is clear and moist.  Eyes: Conjunctivae are normal. Pupils are equal, round, and reactive to light.  Neck: Normal range of motion. No JVD present. No thyromegaly present.  Cardiovascular: Normal rate, regular rhythm, normal heart sounds and intact distal pulses.  Exam reveals no gallop and no friction rub.   No  murmur heard. Pulmonary/Chest: Effort normal and breath sounds normal. No respiratory distress. He has no wheezes. He has no rales. He exhibits no tenderness.  Abdominal: Soft. Bowel sounds are normal. He exhibits no distension and no mass. There is no tenderness. There is no rebound and no guarding.  Genitourinary:  Pt deferred rectal exam to GI  Musculoskeletal: Normal range of motion. He exhibits tenderness (cerv spine is tender). He exhibits no edema.  Lymphadenopathy:    He has no cervical adenopathy.  Neurological: He is alert and oriented to person, place, and time. He has normal reflexes. No cranial nerve deficit. He exhibits normal muscle tone. Coordination normal.  Skin: Skin is warm and dry. No rash noted.  Psychiatric: He has a normal mood and affect. His behavior is normal. Judgment and thought content normal.   Lab Results  Component Value Date   WBC 4.4* 02/22/2013   HGB 15.5 02/22/2013   HCT 44.9 02/22/2013   PLT 221.0 02/22/2013   GLUCOSE 102* 02/22/2013   CHOL 210* 02/22/2013   TRIG 134.0 02/22/2013   HDL 43.70 02/22/2013   LDLDIRECT 151.0 02/22/2013   LDLCALC 143* 04/18/2011   ALT 22 02/22/2013   AST 20 02/22/2013   NA 138 02/22/2013   K 3.9 02/22/2013   CL 104 02/22/2013   CREATININE 1.1 02/22/2013   BUN 9 02/22/2013   CO2 29 02/22/2013   TSH 1.07 02/22/2013   PSA 0.32 02/22/2013   HGBA1C 5.1 05/12/2009    EKG is ok      Assessment & Plan:

## 2013-05-28 ENCOUNTER — Telehealth: Payer: Self-pay | Admitting: Internal Medicine

## 2013-05-28 NOTE — Telephone Encounter (Signed)
Relevant patient education assigned to patient using Emmi. ° °

## 2013-06-05 ENCOUNTER — Encounter: Payer: Self-pay | Admitting: Internal Medicine

## 2013-08-05 ENCOUNTER — Other Ambulatory Visit: Payer: Self-pay | Admitting: Internal Medicine

## 2013-08-08 ENCOUNTER — Telehealth: Payer: Self-pay | Admitting: Internal Medicine

## 2013-08-08 NOTE — Telephone Encounter (Signed)
Patient is calling because he states that [facility] did not receive authorization to refill his Ambien rx. He spoke with them today. Can rx be re-sent.

## 2013-08-08 NOTE — Telephone Encounter (Signed)
Done

## 2013-08-27 ENCOUNTER — Ambulatory Visit: Payer: BC Managed Care – PPO | Admitting: Internal Medicine

## 2013-09-25 ENCOUNTER — Encounter: Payer: Self-pay | Admitting: Internal Medicine

## 2013-09-25 ENCOUNTER — Ambulatory Visit (INDEPENDENT_AMBULATORY_CARE_PROVIDER_SITE_OTHER): Payer: BC Managed Care – PPO | Admitting: Internal Medicine

## 2013-09-25 VITALS — BP 110/78 | HR 82 | Temp 97.8°F | Resp 16 | Ht 71.0 in | Wt 197.2 lb

## 2013-09-25 DIAGNOSIS — K2100 Gastro-esophageal reflux disease with esophagitis, without bleeding: Secondary | ICD-10-CM

## 2013-09-25 DIAGNOSIS — I10 Essential (primary) hypertension: Secondary | ICD-10-CM

## 2013-09-25 DIAGNOSIS — G4700 Insomnia, unspecified: Secondary | ICD-10-CM

## 2013-09-25 DIAGNOSIS — K219 Gastro-esophageal reflux disease without esophagitis: Secondary | ICD-10-CM | POA: Insufficient documentation

## 2013-09-25 MED ORDER — ZOLPIDEM TARTRATE 10 MG PO TABS: ORAL_TABLET | ORAL | Status: DC

## 2013-09-25 MED ORDER — OMEPRAZOLE 20 MG PO CPDR: DELAYED_RELEASE_CAPSULE | ORAL | Status: DC

## 2013-09-25 NOTE — Progress Notes (Signed)
Pre visit review using our clinic review tool, if applicable. No additional management support is needed unless otherwise documented below in the visit note. 

## 2013-09-25 NOTE — Assessment & Plan Note (Signed)
Continue with current prescription therapy as reflected on the Med list.  

## 2013-09-25 NOTE — Assessment & Plan Note (Addendum)
Continue with current prn prescription therapy as reflected on the Med list. Pt states - not using THC

## 2013-09-25 NOTE — Progress Notes (Signed)
   Subjective:    HPI   The patient is here to follow up on chronic HTN (off Rx SBP at home 140), ED, insomnia and chronic moderate neck pain symptoms controlled with medicines, diet and exercise.  BP Readings from Last 3 Encounters:  09/25/13 110/78  05/27/13 140/85  05/06/13 110/76   Wt Readings from Last 3 Encounters:  09/25/13 197 lb 4 oz (89.472 kg)  05/27/13 188 lb (85.276 kg)  05/06/13 187 lb 8 oz (85.049 kg)      Review of Systems  Constitutional: Negative for appetite change, fatigue and unexpected weight change.  HENT: Negative for congestion, ear pain, nosebleeds, sneezing, sore throat and trouble swallowing.   Eyes: Negative for itching and visual disturbance.  Respiratory: Negative for cough.   Cardiovascular: Negative for chest pain, palpitations and leg swelling.  Gastrointestinal: Negative for nausea, diarrhea, blood in stool and abdominal distention.  Endocrine: Negative for polydipsia and polyuria.  Genitourinary: Negative for dysuria, urgency, frequency, hematuria, flank pain and difficulty urinating.  Musculoskeletal: Negative for arthralgias, back pain, gait problem, joint swelling and neck pain.       Neck pain  Skin: Negative for rash.  Neurological: Negative for dizziness, tremors, syncope, speech difficulty and weakness.  Psychiatric/Behavioral: Negative for suicidal ideas, sleep disturbance, dysphoric mood and agitation. The patient is not nervous/anxious.        Objective:   Physical Exam  Constitutional: He is oriented to person, place, and time. He appears well-developed and well-nourished. No distress.  HENT:  Mouth/Throat: Oropharynx is clear and moist.  Eyes: Conjunctivae are normal. Pupils are equal, round, and reactive to light.  Neck: Normal range of motion. No JVD present. No thyromegaly present.  Cardiovascular: Normal rate, regular rhythm, normal heart sounds and intact distal pulses.  Exam reveals no gallop and no friction rub.    No murmur heard. Pulmonary/Chest: Effort normal and breath sounds normal. No respiratory distress. He has no wheezes. He has no rales. He exhibits no tenderness.  Abdominal: Soft. Bowel sounds are normal. He exhibits no distension and no mass. There is no tenderness. There is no rebound and no guarding.  Genitourinary:  Pt deferred rectal exam to GI  Musculoskeletal: Normal range of motion. He exhibits tenderness (cerv spine is tender). He exhibits no edema.  Lymphadenopathy:    He has no cervical adenopathy.  Neurological: He is alert and oriented to person, place, and time. He has normal reflexes. No cranial nerve deficit. He exhibits normal muscle tone. Coordination normal.  Skin: Skin is warm and dry. No rash noted.  Psychiatric: He has a normal mood and affect. His behavior is normal. Judgment and thought content normal.   Lab Results  Component Value Date   WBC 4.4* 02/22/2013   HGB 15.5 02/22/2013   HCT 44.9 02/22/2013   PLT 221.0 02/22/2013   GLUCOSE 102* 02/22/2013   CHOL 210* 02/22/2013   TRIG 134.0 02/22/2013   HDL 43.70 02/22/2013   LDLDIRECT 151.0 02/22/2013   LDLCALC 143* 04/18/2011   ALT 22 02/22/2013   AST 20 02/22/2013   NA 138 02/22/2013   K 3.9 02/22/2013   CL 104 02/22/2013   CREATININE 1.1 02/22/2013   BUN 9 02/22/2013   CO2 29 02/22/2013   TSH 1.07 02/22/2013   PSA 0.32 02/22/2013   HGBA1C 5.1 05/12/2009    EKG is ok      Assessment & Plan:

## 2013-09-25 NOTE — Assessment & Plan Note (Signed)
Prilosec q am

## 2013-12-27 ENCOUNTER — Other Ambulatory Visit: Payer: Self-pay

## 2014-02-03 ENCOUNTER — Other Ambulatory Visit: Payer: Self-pay | Admitting: Internal Medicine

## 2014-02-05 ENCOUNTER — Ambulatory Visit (INDEPENDENT_AMBULATORY_CARE_PROVIDER_SITE_OTHER): Payer: BC Managed Care – PPO | Admitting: Internal Medicine

## 2014-02-05 ENCOUNTER — Encounter: Payer: Self-pay | Admitting: Internal Medicine

## 2014-02-05 VITALS — BP 150/90 | HR 82 | Temp 98.3°F | Wt 200.0 lb

## 2014-02-05 DIAGNOSIS — Z23 Encounter for immunization: Secondary | ICD-10-CM

## 2014-02-05 DIAGNOSIS — G4700 Insomnia, unspecified: Secondary | ICD-10-CM

## 2014-02-05 DIAGNOSIS — I10 Essential (primary) hypertension: Secondary | ICD-10-CM

## 2014-02-05 MED ORDER — TRIAMCINOLONE ACETONIDE 0.5 % EX OINT
1.0000 | TOPICAL_OINTMENT | Freq: Two times a day (BID) | CUTANEOUS | Status: DC
Start: 2014-02-05 — End: 2014-08-08

## 2014-02-05 MED ORDER — TRIAMCINOLONE ACETONIDE 0.5 % EX OINT: 1.0000 "application " | TOPICAL_OINTMENT | Freq: Two times a day (BID) | CUTANEOUS | Status: DC

## 2014-02-05 NOTE — Assessment & Plan Note (Signed)
Continue with current prn prescription therapy as reflected on the Med list.  

## 2014-02-05 NOTE — Progress Notes (Signed)
Pre visit review using our clinic review tool, if applicable. No additional management support is needed unless otherwise documented below in the visit note. 

## 2014-02-05 NOTE — Assessment & Plan Note (Signed)
Restart Rx (ran out)

## 2014-02-07 ENCOUNTER — Encounter: Payer: Self-pay | Admitting: Internal Medicine

## 2014-02-07 NOTE — Progress Notes (Signed)
   Subjective:    HPI   The patient is here to follow up on chronic HTN (off Rx SBP at home 130-140), ED, insomnia and chronic moderate neck pain symptoms controlled with medicines, diet and exercise.  BP Readings from Last 3 Encounters:  02/05/14 150/90  09/25/13 110/78  05/27/13 140/85   Wt Readings from Last 3 Encounters:  02/05/14 200 lb (90.719 kg)  09/25/13 197 lb 4 oz (89.472 kg)  05/27/13 188 lb (85.276 kg)      Review of Systems  Constitutional: Negative for appetite change, fatigue and unexpected weight change.  HENT: Negative for congestion, ear pain, nosebleeds, sneezing, sore throat and trouble swallowing.   Eyes: Negative for itching and visual disturbance.  Respiratory: Negative for cough.   Cardiovascular: Negative for chest pain, palpitations and leg swelling.  Gastrointestinal: Negative for nausea, diarrhea, blood in stool and abdominal distention.  Endocrine: Negative for polydipsia and polyuria.  Genitourinary: Negative for dysuria, urgency, frequency, hematuria, flank pain and difficulty urinating.  Musculoskeletal: Negative for back pain, joint swelling, arthralgias, gait problem and neck pain.       Neck pain  Skin: Negative for rash.  Neurological: Negative for dizziness, tremors, syncope, speech difficulty and weakness.  Psychiatric/Behavioral: Negative for suicidal ideas, sleep disturbance, dysphoric mood and agitation. The patient is not nervous/anxious.        Objective:   Physical Exam  Constitutional: He is oriented to person, place, and time. He appears well-developed. No distress.  NAD  HENT:  Mouth/Throat: Oropharynx is clear and moist.  Eyes: Conjunctivae are normal. Pupils are equal, round, and reactive to light.  Neck: Normal range of motion. No JVD present. No thyromegaly present.  Cardiovascular: Normal rate, regular rhythm, normal heart sounds and intact distal pulses.  Exam reveals no gallop and no friction rub.   No murmur  heard. Pulmonary/Chest: Effort normal and breath sounds normal. No respiratory distress. He has no wheezes. He has no rales. He exhibits no tenderness.  Abdominal: Soft. Bowel sounds are normal. He exhibits no distension and no mass. There is no tenderness. There is no rebound and no guarding.  Musculoskeletal: Normal range of motion. He exhibits no edema or tenderness.  Lymphadenopathy:    He has no cervical adenopathy.  Neurological: He is alert and oriented to person, place, and time. He has normal reflexes. No cranial nerve deficit. He exhibits normal muscle tone. He displays a negative Romberg sign. Coordination and gait normal.  No meningeal signs  Skin: Skin is warm and dry. No rash noted.  Psychiatric: He has a normal mood and affect. His behavior is normal. Judgment and thought content normal.   Lab Results  Component Value Date   WBC 4.4* 02/22/2013   HGB 15.5 02/22/2013   HCT 44.9 02/22/2013   PLT 221.0 02/22/2013   GLUCOSE 102* 02/22/2013   CHOL 210* 02/22/2013   TRIG 134.0 02/22/2013   HDL 43.70 02/22/2013   LDLDIRECT 151.0 02/22/2013   LDLCALC 143* 04/18/2011   ALT 22 02/22/2013   AST 20 02/22/2013   NA 138 02/22/2013   K 3.9 02/22/2013   CL 104 02/22/2013   CREATININE 1.1 02/22/2013   BUN 9 02/22/2013   CO2 29 02/22/2013   TSH 1.07 02/22/2013   PSA 0.32 02/22/2013   HGBA1C 5.1 05/12/2009          Assessment & Plan:

## 2014-05-05 ENCOUNTER — Other Ambulatory Visit: Payer: Self-pay | Admitting: Internal Medicine

## 2014-07-31 ENCOUNTER — Encounter: Payer: Self-pay | Admitting: Internal Medicine

## 2014-08-03 ENCOUNTER — Other Ambulatory Visit: Payer: Self-pay | Admitting: Internal Medicine

## 2014-08-03 DIAGNOSIS — Z0000 Encounter for general adult medical examination without abnormal findings: Secondary | ICD-10-CM

## 2014-08-05 ENCOUNTER — Other Ambulatory Visit (INDEPENDENT_AMBULATORY_CARE_PROVIDER_SITE_OTHER): Payer: BLUE CROSS/BLUE SHIELD

## 2014-08-05 DIAGNOSIS — Z0000 Encounter for general adult medical examination without abnormal findings: Secondary | ICD-10-CM | POA: Diagnosis not present

## 2014-08-05 DIAGNOSIS — R7989 Other specified abnormal findings of blood chemistry: Secondary | ICD-10-CM | POA: Diagnosis not present

## 2014-08-05 LAB — CBC WITH DIFFERENTIAL/PLATELET
BASOS PCT: 0.4 % (ref 0.0–3.0)
Basophils Absolute: 0 10*3/uL (ref 0.0–0.1)
EOS PCT: 2.2 % (ref 0.0–5.0)
Eosinophils Absolute: 0.1 10*3/uL (ref 0.0–0.7)
HEMATOCRIT: 48 % (ref 39.0–52.0)
HEMOGLOBIN: 16.9 g/dL (ref 13.0–17.0)
Lymphocytes Relative: 32.9 % (ref 12.0–46.0)
Lymphs Abs: 2 10*3/uL (ref 0.7–4.0)
MCHC: 35.3 g/dL (ref 30.0–36.0)
MCV: 94.8 fl (ref 78.0–100.0)
MONO ABS: 0.5 10*3/uL (ref 0.1–1.0)
Monocytes Relative: 9 % (ref 3.0–12.0)
Neutro Abs: 3.3 10*3/uL (ref 1.4–7.7)
Neutrophils Relative %: 55.5 % (ref 43.0–77.0)
Platelets: 223 10*3/uL (ref 150.0–400.0)
RBC: 5.07 Mil/uL (ref 4.22–5.81)
RDW: 12.4 % (ref 11.5–15.5)
WBC: 5.9 10*3/uL (ref 4.0–10.5)

## 2014-08-05 LAB — URINALYSIS
Bilirubin Urine: NEGATIVE
Hgb urine dipstick: NEGATIVE
KETONES UR: NEGATIVE
Leukocytes, UA: NEGATIVE
NITRITE: NEGATIVE
PH: 6 (ref 5.0–8.0)
SPECIFIC GRAVITY, URINE: 1.025 (ref 1.000–1.030)
Total Protein, Urine: NEGATIVE
Urine Glucose: NEGATIVE
Urobilinogen, UA: 0.2 (ref 0.0–1.0)

## 2014-08-05 LAB — BASIC METABOLIC PANEL
BUN: 15 mg/dL (ref 6–23)
CO2: 29 mEq/L (ref 19–32)
Calcium: 9.8 mg/dL (ref 8.4–10.5)
Chloride: 100 mEq/L (ref 96–112)
Creatinine, Ser: 1.13 mg/dL (ref 0.40–1.50)
GFR: 72.01 mL/min (ref >60.00)
Glucose, Bld: 122 mg/dL — ABNORMAL HIGH (ref 70–99)
Potassium: 4.3 mEq/L (ref 3.5–5.1)
Sodium: 137 mEq/L (ref 135–145)

## 2014-08-05 LAB — LIPID PANEL
CHOL/HDL RATIO: 6
Cholesterol: 208 mg/dL — ABNORMAL HIGH (ref 0–200)
HDL: 34.1 mg/dL — ABNORMAL LOW (ref >39.00)
NonHDL: 173.9
Triglycerides: 327 mg/dL — ABNORMAL HIGH (ref 0.0–149.0)
VLDL: 65.4 mg/dL — AB (ref 0.0–40.0)

## 2014-08-05 LAB — HEPATIC FUNCTION PANEL
ALT: 79 U/L — ABNORMAL HIGH (ref 0–53)
AST: 52 U/L — ABNORMAL HIGH (ref 0–37)
Albumin: 4.5 g/dL (ref 3.5–5.2)
Alkaline Phosphatase: 58 U/L (ref 39–117)
BILIRUBIN DIRECT: 0.2 mg/dL (ref 0.0–0.3)
BILIRUBIN TOTAL: 0.8 mg/dL (ref 0.2–1.2)
TOTAL PROTEIN: 7.6 g/dL (ref 6.0–8.3)

## 2014-08-05 LAB — PSA: PSA: 0.33 ng/mL (ref 0.10–4.00)

## 2014-08-05 LAB — TSH: TSH: 1.46 u[IU]/mL (ref 0.35–4.50)

## 2014-08-05 LAB — LDL CHOLESTEROL, DIRECT: Direct LDL: 135 mg/dL

## 2014-08-08 ENCOUNTER — Ambulatory Visit (INDEPENDENT_AMBULATORY_CARE_PROVIDER_SITE_OTHER): Payer: BLUE CROSS/BLUE SHIELD | Admitting: Internal Medicine

## 2014-08-08 ENCOUNTER — Encounter: Payer: Self-pay | Admitting: Internal Medicine

## 2014-08-08 VITALS — BP 144/80 | HR 101 | Temp 98.5°F | Ht 71.0 in | Wt 205.0 lb

## 2014-08-08 DIAGNOSIS — Z0000 Encounter for general adult medical examination without abnormal findings: Secondary | ICD-10-CM

## 2014-08-08 DIAGNOSIS — K219 Gastro-esophageal reflux disease without esophagitis: Secondary | ICD-10-CM

## 2014-08-08 DIAGNOSIS — E785 Hyperlipidemia, unspecified: Secondary | ICD-10-CM

## 2014-08-08 DIAGNOSIS — I10 Essential (primary) hypertension: Secondary | ICD-10-CM

## 2014-08-08 DIAGNOSIS — R635 Abnormal weight gain: Secondary | ICD-10-CM

## 2014-08-08 MED ORDER — MEGARED OMEGA-3 KRILL OIL 500 MG PO CAPS: 1.0000 | ORAL_CAPSULE | Freq: Every morning | ORAL | Status: DC

## 2014-08-08 MED ORDER — OMEPRAZOLE 20 MG PO CPDR: 20.0000 mg | DELAYED_RELEASE_CAPSULE | Freq: Every day | ORAL | Status: DC

## 2014-08-08 MED ORDER — ASPIRIN 81 MG PO CHEW: 81.0000 mg | CHEWABLE_TABLET | Freq: Every day | ORAL | Status: DC

## 2014-08-08 MED ORDER — LOSARTAN POTASSIUM-HCTZ 100-12.5 MG PO TABS: 1.0000 | ORAL_TABLET | Freq: Every day | ORAL | Status: DC

## 2014-08-08 NOTE — Progress Notes (Signed)
Pre visit review using our clinic review tool, if applicable. No additional management support is needed unless otherwise documented below in the visit note. 

## 2014-08-08 NOTE — Progress Notes (Signed)
Subjective:    HPI  The patient is here for a wellness exam. The patient has been doing well overall without major physical or psychological issues going on lately  The patient is here to follow up on chronic HTN (off Rx SBP at home 140), ED, insomnia and chronic moderate neck pain symptoms controlled with medicines, diet and exercise.  BP Readings from Last 3 Encounters:  08/08/14 144/80  02/05/14 150/90  09/25/13 110/78   Wt Readings from Last 3 Encounters:  08/08/14 205 lb (92.987 kg)  02/05/14 200 lb (90.719 kg)  09/25/13 197 lb 4 oz (89.472 kg)      Review of Systems  Constitutional: Positive for unexpected weight change. Negative for appetite change and fatigue.  HENT: Negative for congestion, ear pain, nosebleeds, sneezing, sore throat and trouble swallowing.   Eyes: Negative for itching and visual disturbance.  Respiratory: Negative for cough.   Cardiovascular: Negative for chest pain, palpitations and leg swelling.  Gastrointestinal: Negative for nausea, diarrhea, blood in stool and abdominal distention.  Endocrine: Negative for polydipsia and polyuria.  Genitourinary: Negative for dysuria, urgency, frequency, hematuria, flank pain and difficulty urinating.  Musculoskeletal: Negative for back pain, joint swelling, arthralgias, gait problem and neck pain.       Neck pain  Skin: Negative for rash.  Neurological: Negative for dizziness, tremors, syncope, speech difficulty and weakness.  Psychiatric/Behavioral: Negative for suicidal ideas, sleep disturbance, dysphoric mood and agitation. The patient is not nervous/anxious.        Objective:   Physical Exam  Constitutional: He is oriented to person, place, and time. He appears well-developed. No distress.  Obese  HENT:  Mouth/Throat: Oropharynx is clear and moist.  Eyes: Conjunctivae are normal. Pupils are equal, round, and reactive to light.  Neck: Normal range of motion. No JVD present. No thyromegaly present.   Cardiovascular: Normal rate, regular rhythm, normal heart sounds and intact distal pulses.  Exam reveals no gallop and no friction rub.   No murmur heard. Pulmonary/Chest: Effort normal and breath sounds normal. No respiratory distress. He has no wheezes. He has no rales. He exhibits no tenderness.  Abdominal: Soft. Bowel sounds are normal. He exhibits no distension and no mass. There is no tenderness. There is no rebound and no guarding.  Genitourinary:  Pt deferred rectal exam to GI  Musculoskeletal: Normal range of motion. He exhibits tenderness (cerv spine is tender). He exhibits no edema.  Lymphadenopathy:    He has no cervical adenopathy.  Neurological: He is alert and oriented to person, place, and time. He has normal reflexes. No cranial nerve deficit. He exhibits normal muscle tone. Coordination normal.  Skin: Skin is warm and dry. No rash noted.  Psychiatric: He has a normal mood and affect. His behavior is normal. Judgment and thought content normal.   Lab Results  Component Value Date   WBC 5.9 08/05/2014   HGB 16.9 08/05/2014   HCT 48.0 08/05/2014   PLT 223.0 08/05/2014   GLUCOSE 122* 08/05/2014   CHOL 208* 08/05/2014   TRIG 327.0* 08/05/2014   HDL 34.10* 08/05/2014   LDLDIRECT 135.0 08/05/2014   LDLCALC 143* 04/18/2011   ALT 79* 08/05/2014   AST 52* 08/05/2014   NA 137 08/05/2014   K 4.3 08/05/2014   CL 100 08/05/2014   CREATININE 1.13 08/05/2014   BUN 15 08/05/2014   CO2 29 08/05/2014   TSH 1.46 08/05/2014   PSA 0.33 08/05/2014   HGBA1C 5.1 05/12/2009  Assessment & Plan:

## 2014-08-08 NOTE — Assessment & Plan Note (Signed)
Worse Loose wt

## 2014-08-08 NOTE — Assessment & Plan Note (Signed)
On Prilodsec

## 2014-08-08 NOTE — Assessment & Plan Note (Signed)
We discussed age appropriate health related issues, including available/recomended screening tests and vaccinations. We discussed a need for adhering to healthy diet and exercise. Labs/EKG were reviewed/ordered. All questions were answered.  Colon 1/15 Labs

## 2014-08-08 NOTE — Assessment & Plan Note (Addendum)
Losartan HCT NAS diet

## 2014-08-12 DIAGNOSIS — R635 Abnormal weight gain: Secondary | ICD-10-CM | POA: Insufficient documentation

## 2014-08-12 NOTE — Assessment & Plan Note (Signed)
Low carb diet TSH

## 2015-02-09 ENCOUNTER — Ambulatory Visit: Payer: BLUE CROSS/BLUE SHIELD | Admitting: Internal Medicine

## 2015-08-07 ENCOUNTER — Other Ambulatory Visit: Payer: Self-pay | Admitting: *Deleted

## 2015-08-07 MED ORDER — OMEPRAZOLE 20 MG PO CPDR: 20.0000 mg | DELAYED_RELEASE_CAPSULE | Freq: Every day | ORAL | Status: DC

## 2015-08-21 ENCOUNTER — Ambulatory Visit (INDEPENDENT_AMBULATORY_CARE_PROVIDER_SITE_OTHER): Payer: BLUE CROSS/BLUE SHIELD | Admitting: Internal Medicine

## 2015-08-21 ENCOUNTER — Encounter: Payer: Self-pay | Admitting: Internal Medicine

## 2015-08-21 ENCOUNTER — Other Ambulatory Visit (INDEPENDENT_AMBULATORY_CARE_PROVIDER_SITE_OTHER): Payer: BLUE CROSS/BLUE SHIELD

## 2015-08-21 VITALS — BP 148/88 | HR 71 | Temp 98.0°F | Wt 209.0 lb

## 2015-08-21 DIAGNOSIS — R945 Abnormal results of liver function studies: Secondary | ICD-10-CM

## 2015-08-21 DIAGNOSIS — R7989 Other specified abnormal findings of blood chemistry: Secondary | ICD-10-CM | POA: Diagnosis not present

## 2015-08-21 DIAGNOSIS — L609 Nail disorder, unspecified: Secondary | ICD-10-CM

## 2015-08-21 LAB — CBC WITH DIFFERENTIAL/PLATELET
Basophils Absolute: 0 10*3/uL (ref 0.0–0.1)
Basophils Relative: 0.5 % (ref 0.0–3.0)
EOS PCT: 2.1 % (ref 0.0–5.0)
Eosinophils Absolute: 0.1 10*3/uL (ref 0.0–0.7)
HEMATOCRIT: 45.8 % (ref 39.0–52.0)
HEMOGLOBIN: 15.8 g/dL (ref 13.0–17.0)
LYMPHS PCT: 32.8 % (ref 12.0–46.0)
Lymphs Abs: 1.9 10*3/uL (ref 0.7–4.0)
MCHC: 34.4 g/dL (ref 30.0–36.0)
MCV: 93.3 fl (ref 78.0–100.0)
MONOS PCT: 10.5 % (ref 3.0–12.0)
Monocytes Absolute: 0.6 10*3/uL (ref 0.1–1.0)
NEUTROS ABS: 3.1 10*3/uL (ref 1.4–7.7)
Neutrophils Relative %: 54.1 % (ref 43.0–77.0)
Platelets: 199 10*3/uL (ref 150.0–400.0)
RBC: 4.92 Mil/uL (ref 4.22–5.81)
RDW: 12.8 % (ref 11.5–15.5)
WBC: 5.7 10*3/uL (ref 4.0–10.5)

## 2015-08-21 LAB — BASIC METABOLIC PANEL
BUN: 14 mg/dL (ref 6–23)
CALCIUM: 9.9 mg/dL (ref 8.4–10.5)
CHLORIDE: 101 meq/L (ref 96–112)
CO2: 31 meq/L (ref 19–32)
Creatinine, Ser: 1.04 mg/dL (ref 0.40–1.50)
GFR: 78.94 mL/min (ref >60.00)
GLUCOSE: 106 mg/dL — AB (ref 70–99)
POTASSIUM: 4.1 meq/L (ref 3.5–5.1)
SODIUM: 138 meq/L (ref 135–145)

## 2015-08-21 LAB — VITAMIN D 25 HYDROXY (VIT D DEFICIENCY, FRACTURES): VITD: 33.6 ng/mL (ref 30.00–100.00)

## 2015-08-21 LAB — HEPATIC FUNCTION PANEL
ALT: 90 U/L — ABNORMAL HIGH (ref 0–53)
AST: 52 U/L — AB (ref 0–37)
Albumin: 4.6 g/dL (ref 3.5–5.2)
Alkaline Phosphatase: 51 U/L (ref 39–117)
BILIRUBIN TOTAL: 0.7 mg/dL (ref 0.2–1.2)
Bilirubin, Direct: 0.2 mg/dL (ref 0.0–0.3)
Total Protein: 7.8 g/dL (ref 6.0–8.3)

## 2015-08-21 LAB — TSH: TSH: 1.35 u[IU]/mL (ref 0.35–4.50)

## 2015-08-21 LAB — VITAMIN B12: Vitamin B-12: 258 pg/mL (ref 211–911)

## 2015-08-21 MED ORDER — TRIAMCINOLONE ACETONIDE 0.5 % EX OINT: 1.0000 "application " | TOPICAL_OINTMENT | Freq: Two times a day (BID) | CUTANEOUS | Status: DC

## 2015-08-21 NOTE — Assessment & Plan Note (Signed)
6/17 thick fingernails - ?psoriasis Triamc oint Derm ref

## 2015-08-21 NOTE — Progress Notes (Signed)
Subjective:  Patient ID: Dale Campbell, male    DOB: Jul 01, 1960  Age: 55 y.o. MRN: [id number]  CC: Nail Problem   HPI [name] presents for thick fingernails x 6 mo  Outpatient Prescriptions Prior to Visit  Medication Sig Dispense Refill  . aspirin (ASPIRIN CHILDRENS) 81 MG chewable tablet Chew 1 tablet (81 mg total) by mouth daily. 100 tablet 3  . Cholecalciferol 1000 UNITS capsule Take 1,000 Units by mouth daily.      Marland Kitchen losartan-hydrochlorothiazide (HYZAAR) 100-12.5 MG per tablet Take 1 tablet by mouth daily. 90 tablet 3  . MEGARED OMEGA-3 KRILL OIL 500 MG CAPS Take 1 capsule by mouth every morning. 100 capsule 3  . omeprazole (PRILOSEC) 20 MG capsule Take 1 capsule (20 mg total) by mouth daily. 90 capsule 0   No facility-administered medications prior to visit.    ROS Review of Systems  Constitutional: Negative for appetite change, fatigue and unexpected weight change.  HENT: Negative for congestion, nosebleeds, sneezing, sore throat and trouble swallowing.   Eyes: Negative for itching and visual disturbance.  Respiratory: Negative for cough.   Cardiovascular: Negative for chest pain, palpitations and leg swelling.  Gastrointestinal: Negative for nausea, diarrhea, blood in stool and abdominal distention.  Genitourinary: Negative for frequency and hematuria.  Musculoskeletal: Negative for back pain, joint swelling, gait problem and neck pain.  Skin: Positive for rash.  Neurological: Negative for dizziness, tremors, speech difficulty and weakness.  Psychiatric/Behavioral: Negative for sleep disturbance, dysphoric mood and agitation. The patient is not nervous/anxious.     Objective:  BP 148/88 mmHg  Pulse 71  Temp(Src) 98 F (36.7 C) (Oral)  Wt 209 lb (94.802 kg)  SpO2 95%  BP Readings from Last 3 Encounters:  08/21/15 148/88  08/08/14 144/80  02/05/14 150/90    Wt Readings from Last 3 Encounters:  08/21/15 209 lb (94.802 kg)  08/08/14 205 lb  (92.987 kg)  02/05/14 200 lb (90.719 kg)    Physical Exam  Constitutional: He is oriented to person, place, and time. He appears well-developed. No distress.  NAD  HENT:  Mouth/Throat: Oropharynx is clear and moist.  Eyes: Conjunctivae are normal. Pupils are equal, round, and reactive to light.  Neck: Normal range of motion. No JVD present. No thyromegaly present.  Cardiovascular: Normal rate, regular rhythm, normal heart sounds and intact distal pulses.  Exam reveals no gallop and no friction rub.   No murmur heard. Pulmonary/Chest: Effort normal and breath sounds normal. No respiratory distress. He has no wheezes. He has no rales. He exhibits no tenderness.  Abdominal: Soft. Bowel sounds are normal. He exhibits no distension and no mass. There is no tenderness. There is no rebound and no guarding.  Musculoskeletal: Normal range of motion. He exhibits no edema or tenderness.  Lymphadenopathy:    He has no cervical adenopathy.  Neurological: He is alert and oriented to person, place, and time. He has normal reflexes. No cranial nerve deficit. He exhibits normal muscle tone. He displays a negative Romberg sign. Coordination and gait normal.  Skin: Skin is warm and dry. Rash noted.  Psychiatric: He has a normal mood and affect. His behavior is normal. Judgment and thought content normal.    Rash on buttocks thick fingernails x10  Lab Results  Component Value Date   WBC 5.9 08/05/2014   HGB 16.9 08/05/2014   HCT 48.0 08/05/2014   PLT 223.0 08/05/2014   GLUCOSE 122* 08/05/2014   CHOL 208* 08/05/2014   TRIG  327.0* 08/05/2014   HDL 34.10* 08/05/2014   LDLDIRECT 135.0 08/05/2014   LDLCALC 143* 04/18/2011   ALT 79* 08/05/2014   AST 52* 08/05/2014   NA 137 08/05/2014   K 4.3 08/05/2014   CL 100 08/05/2014   CREATININE 1.13 08/05/2014   BUN 15 08/05/2014   CO2 29 08/05/2014   TSH 1.46 08/05/2014   PSA 0.33 08/05/2014   HGBA1C 5.1 05/12/2009    Dg Neck Soft Tissue  04/22/2011   *RADIOLOGY REPORT* Clinical Data: Dull left-sided neck pain for 1 month NECK SOFT TISSUES - 1+ VIEW Comparison: None. Findings: A soft tissue lateral view of the neck shows the cervical vertebrae to be in normal alignment.  Only minimal degenerative change is noted at the C5-6 level.  No prevertebral soft tissue swelling is seen.  The hypopharyngeal airway appears normal.  The epiglottis is normal in size.  No opaque foreign body is seen. IMPRESSION: 1.  Straightened alignment of the cervical vertebrae with mild degenerative disc disease at C5-6. 2.  The epiglottis and airway appear normal.  No opaque foreign body. Original Report Authenticated By: Dale Campbell, M.D.   Assessment & Plan:   There are no diagnoses linked to this encounter. I am having Dale Campbell maintain his Cholecalciferol, losartan-hydrochlorothiazide, aspirin, MEGARED OMEGA-3 KRILL OIL, and omeprazole.  No orders of the defined types were placed in this encounter.     Follow-up: No Follow-up on file.  Dale Kehr, MD

## 2015-08-21 NOTE — Progress Notes (Signed)
Pre visit review using our clinic review tool, if applicable. No additional management support is needed unless otherwise documented below in the visit note. 

## 2015-08-22 ENCOUNTER — Encounter: Payer: Self-pay | Admitting: Internal Medicine

## 2015-08-22 DIAGNOSIS — R945 Abnormal results of liver function studies: Secondary | ICD-10-CM

## 2015-08-22 DIAGNOSIS — R7989 Other specified abnormal findings of blood chemistry: Secondary | ICD-10-CM | POA: Insufficient documentation

## 2015-08-22 NOTE — Assessment & Plan Note (Signed)
Liver US

## 2015-09-08 ENCOUNTER — Encounter: Payer: Self-pay | Admitting: Internal Medicine

## 2015-10-06 DIAGNOSIS — D239 Other benign neoplasm of skin, unspecified: Secondary | ICD-10-CM | POA: Diagnosis not present

## 2015-10-06 DIAGNOSIS — B359 Dermatophytosis, unspecified: Secondary | ICD-10-CM | POA: Diagnosis not present

## 2015-10-06 DIAGNOSIS — L603 Nail dystrophy: Secondary | ICD-10-CM | POA: Diagnosis not present

## 2015-11-09 ENCOUNTER — Other Ambulatory Visit: Payer: Self-pay | Admitting: Internal Medicine

## 2015-11-30 ENCOUNTER — Other Ambulatory Visit: Payer: Self-pay | Admitting: Internal Medicine

## 2016-07-27 ENCOUNTER — Other Ambulatory Visit: Payer: Self-pay | Admitting: *Deleted

## 2016-07-27 MED ORDER — OMEPRAZOLE 20 MG PO CPDR: 20.0000 mg | DELAYED_RELEASE_CAPSULE | Freq: Every day | ORAL | 0 refills | Status: DC

## 2016-11-10 ENCOUNTER — Encounter: Payer: Self-pay | Admitting: Internal Medicine

## 2016-11-16 ENCOUNTER — Other Ambulatory Visit: Payer: Self-pay | Admitting: Internal Medicine

## 2016-11-16 DIAGNOSIS — Z0000 Encounter for general adult medical examination without abnormal findings: Secondary | ICD-10-CM

## 2016-11-17 ENCOUNTER — Other Ambulatory Visit (INDEPENDENT_AMBULATORY_CARE_PROVIDER_SITE_OTHER): Payer: BLUE CROSS/BLUE SHIELD

## 2016-11-17 ENCOUNTER — Telehealth: Payer: Self-pay

## 2016-11-17 DIAGNOSIS — Z0000 Encounter for general adult medical examination without abnormal findings: Secondary | ICD-10-CM | POA: Diagnosis not present

## 2016-11-17 DIAGNOSIS — Z299 Encounter for prophylactic measures, unspecified: Secondary | ICD-10-CM

## 2016-11-17 DIAGNOSIS — Z1159 Encounter for screening for other viral diseases: Secondary | ICD-10-CM | POA: Diagnosis not present

## 2016-11-17 LAB — CBC WITH DIFFERENTIAL/PLATELET
BASOS ABS: 0 10*3/uL (ref 0.0–0.1)
Basophils Relative: 0.7 % (ref 0.0–3.0)
EOS ABS: 0.1 10*3/uL (ref 0.0–0.7)
Eosinophils Relative: 3.1 % (ref 0.0–5.0)
HCT: 45.5 % (ref 39.0–52.0)
Hemoglobin: 15.6 g/dL (ref 13.0–17.0)
LYMPHS PCT: 29.5 % (ref 12.0–46.0)
Lymphs Abs: 1.4 10*3/uL (ref 0.7–4.0)
MCHC: 34.3 g/dL (ref 30.0–36.0)
MCV: 92.8 fl (ref 78.0–100.0)
Monocytes Absolute: 0.5 10*3/uL (ref 0.1–1.0)
Monocytes Relative: 10.6 % (ref 3.0–12.0)
NEUTROS ABS: 2.6 10*3/uL (ref 1.4–7.7)
NEUTROS PCT: 56.1 % (ref 43.0–77.0)
PLATELETS: 172 10*3/uL (ref 150.0–400.0)
RBC: 4.91 Mil/uL (ref 4.22–5.81)
RDW: 12.5 % (ref 11.5–15.5)
WBC: 4.6 10*3/uL (ref 4.0–10.5)

## 2016-11-17 LAB — BASIC METABOLIC PANEL
BUN: 12 mg/dL (ref 6–23)
CO2: 26 mEq/L (ref 19–32)
Calcium: 9.5 mg/dL (ref 8.4–10.5)
Chloride: 103 mEq/L (ref 96–112)
Creatinine, Ser: 1.07 mg/dL (ref 0.40–1.50)
GFR: 76.04 mL/min (ref >60.00)
Glucose, Bld: 109 mg/dL — ABNORMAL HIGH (ref 70–99)
Potassium: 4 mEq/L (ref 3.5–5.1)
Sodium: 139 mEq/L (ref 135–145)

## 2016-11-17 LAB — LIPID PANEL
Cholesterol: 175 mg/dL (ref 0–200)
HDL: 39.3 mg/dL (ref >39.00)
LDL CALC: 117 mg/dL — AB (ref 0–99)
NonHDL: 135.68
TRIGLYCERIDES: 95 mg/dL (ref 0.0–149.0)
Total CHOL/HDL Ratio: 4
VLDL: 19 mg/dL (ref 0.0–40.0)

## 2016-11-17 LAB — URINALYSIS
Bilirubin Urine: NEGATIVE
HGB URINE DIPSTICK: NEGATIVE
Ketones, ur: NEGATIVE
Leukocytes, UA: NEGATIVE
NITRITE: NEGATIVE
PH: 6 (ref 5.0–8.0)
SPECIFIC GRAVITY, URINE: 1.02 (ref 1.000–1.030)
TOTAL PROTEIN, URINE-UPE24: NEGATIVE
Urine Glucose: NEGATIVE
Urobilinogen, UA: 0.2 (ref 0.0–1.0)

## 2016-11-17 LAB — HEPATIC FUNCTION PANEL
ALT: 28 U/L (ref 0–53)
AST: 22 U/L (ref 0–37)
Albumin: 4.4 g/dL (ref 3.5–5.2)
Alkaline Phosphatase: 75 U/L (ref 39–117)
Bilirubin, Direct: 0.1 mg/dL (ref 0.0–0.3)
Total Bilirubin: 0.8 mg/dL (ref 0.2–1.2)
Total Protein: 7.1 g/dL (ref 6.0–8.3)

## 2016-11-17 LAB — TSH: TSH: 1.92 u[IU]/mL (ref 0.35–4.50)

## 2016-11-17 LAB — PSA: PSA: 0.28 ng/mL (ref 0.10–4.00)

## 2016-11-17 NOTE — Telephone Encounter (Signed)
error 

## 2016-11-18 ENCOUNTER — Ambulatory Visit (INDEPENDENT_AMBULATORY_CARE_PROVIDER_SITE_OTHER): Payer: BLUE CROSS/BLUE SHIELD | Admitting: Internal Medicine

## 2016-11-18 ENCOUNTER — Encounter: Payer: Self-pay | Admitting: Internal Medicine

## 2016-11-18 VITALS — BP 120/74 | HR 64 | Temp 98.3°F | Ht 71.0 in | Wt 174.0 lb

## 2016-11-18 DIAGNOSIS — G4709 Other insomnia: Secondary | ICD-10-CM | POA: Diagnosis not present

## 2016-11-18 DIAGNOSIS — I10 Essential (primary) hypertension: Secondary | ICD-10-CM | POA: Diagnosis not present

## 2016-11-18 DIAGNOSIS — Z0000 Encounter for general adult medical examination without abnormal findings: Secondary | ICD-10-CM | POA: Diagnosis not present

## 2016-11-18 DIAGNOSIS — M542 Cervicalgia: Secondary | ICD-10-CM

## 2016-11-18 LAB — HEPATITIS C ANTIBODY
Hepatitis C Ab: NONREACTIVE
SIGNAL TO CUT-OFF: 0.02 (ref <1.00)

## 2016-11-18 MED ORDER — ZOLPIDEM TARTRATE 5 MG PO TABS: 5.0000 mg | ORAL_TABLET | Freq: Every evening | ORAL | 2 refills | Status: DC | PRN

## 2016-11-18 MED ORDER — NORTRIPTYLINE HCL 10 MG PO CAPS: 10.0000 mg | ORAL_CAPSULE | Freq: Every day | ORAL | 5 refills | Status: DC

## 2016-11-18 MED ORDER — OMEPRAZOLE 20 MG PO CPDR: 20.0000 mg | DELAYED_RELEASE_CAPSULE | Freq: Every day | ORAL | 11 refills | Status: DC

## 2016-11-18 NOTE — Assessment & Plan Note (Signed)
Losartan HCT - d/c, resolved w/wt loss

## 2016-11-18 NOTE — Assessment & Plan Note (Signed)
Chronic Zolpidem  Potential benefits of a long term benzodiazepines  use as well as potential risks  and complications were explained to the patient and were aknowledged. 9/18 Nortriptyline  Discussed OSA possibility

## 2016-11-18 NOTE — Progress Notes (Signed)
Subjective:  Patient ID: Dale Campbell, male    DOB: 05-03-1960  Age: 56 y.o. MRN: [id number]  CC: No chief complaint on file.   HPI Dale Campbell presents for a well exam C/o insomnia. F/u GERD Lost wt. No ETOH.  Outpatient Medications Prior to Visit  Medication Sig Dispense Refill  . aspirin (ASPIRIN CHILDRENS) 81 MG chewable tablet Chew 1 tablet (81 mg total) by mouth daily. 100 tablet 3  . Cholecalciferol 1000 UNITS capsule Take 1,000 Units by mouth daily.      Marland Kitchen MEGARED OMEGA-3 KRILL OIL 500 MG CAPS Take 1 capsule by mouth every morning. 100 capsule 3  . omeprazole (PRILOSEC) 20 MG capsule Take 1 capsule (20 mg total) by mouth daily. Overdue for annual appt must see Md for refills 30 capsule 0  . triamcinolone ointment (KENALOG) 0.5 % Apply 1 application topically 2 (two) times daily. 30 g 2  . losartan-hydrochlorothiazide (HYZAAR) 100-12.5 MG tablet TAKE 1 TABLET BY MOUTH DAILY (Patient not taking: Reported on 11/18/2016) 90 tablet 2   No facility-administered medications prior to visit.     ROS Review of Systems  Constitutional: Negative for appetite change, fatigue and unexpected weight change.  HENT: Negative for congestion, nosebleeds, sneezing, sore throat and trouble swallowing.   Eyes: Negative for itching and visual disturbance.  Respiratory: Negative for cough.   Cardiovascular: Negative for chest pain, palpitations and leg swelling.  Gastrointestinal: Negative for abdominal distention, blood in stool, diarrhea and nausea.  Genitourinary: Negative for frequency and hematuria.  Musculoskeletal: Negative for back pain, gait problem, joint swelling and neck pain.  Skin: Negative for rash.  Neurological: Negative for dizziness, tremors, speech difficulty and weakness.  Psychiatric/Behavioral: Positive for sleep disturbance. Negative for agitation and dysphoric mood. The patient is not nervous/anxious.     Objective:  BP 120/74 (BP Location: Left Arm, Patient  Position: Sitting, Cuff Size: Large)   Pulse 64   Temp 98.3 F (36.8 C) (Oral)   Ht 5\' 11"  (1.803 m)   Wt 174 lb (78.9 kg)   SpO2 98%   BMI 24.27 kg/m   BP Readings from Last 3 Encounters:  11/18/16 120/74  08/21/15 (!) 148/88  08/08/14 (!) 144/80    Wt Readings from Last 3 Encounters:  11/18/16 174 lb (78.9 kg)  08/21/15 209 lb (94.8 kg)  08/08/14 205 lb (93 kg)    Physical Exam  Constitutional: He is oriented to person, place, and time. He appears well-developed. No distress.  NAD  HENT:  Mouth/Throat: Oropharynx is clear and moist.  Eyes: Pupils are equal, round, and reactive to light. Conjunctivae are normal.  Neck: Normal range of motion. No JVD present. No thyromegaly present.  Cardiovascular: Normal rate, regular rhythm, normal heart sounds and intact distal pulses.  Exam reveals no gallop and no friction rub.   No murmur heard. Pulmonary/Chest: Effort normal and breath sounds normal. No respiratory distress. He has no wheezes. He has no rales. He exhibits no tenderness.  Abdominal: Soft. Bowel sounds are normal. He exhibits no distension and no mass. There is no tenderness. There is no rebound and no guarding.  Genitourinary: Rectum normal. Rectal exam shows guaiac negative stool.  Musculoskeletal: Normal range of motion. He exhibits no edema or tenderness.  Lymphadenopathy:    He has no cervical adenopathy.  Neurological: He is alert and oriented to person, place, and time. He has normal reflexes. No cranial nerve deficit. He exhibits normal muscle tone. He displays a negative  Romberg sign. Coordination and gait normal.  Skin: Skin is warm and dry. No rash noted.  Psychiatric: He has a normal mood and affect. His behavior is normal. Judgment and thought content normal.  prostate 1(+)  Lab Results  Component Value Date   WBC 4.6 11/17/2016   HGB 15.6 11/17/2016   HCT 45.5 11/17/2016   PLT 172.0 11/17/2016   GLUCOSE 109 (H) 11/17/2016   CHOL 175 11/17/2016    TRIG 95.0 11/17/2016   HDL 39.30 11/17/2016   LDLDIRECT 135.0 08/05/2014   LDLCALC 117 (H) 11/17/2016   ALT 28 11/17/2016   AST 22 11/17/2016   NA 139 11/17/2016   K 4.0 11/17/2016   CL 103 11/17/2016   CREATININE 1.07 11/17/2016   BUN 12 11/17/2016   CO2 26 11/17/2016   TSH 1.92 11/17/2016   PSA 0.28 11/17/2016   HGBA1C 5.1 05/12/2009    Dg Neck Soft Tissue  Result Date: 04/22/2011 *RADIOLOGY REPORT* Clinical Data: Dull left-sided neck pain for 1 month NECK SOFT TISSUES - 1+ VIEW Comparison: None. Findings: A soft tissue lateral view of the neck shows the cervical vertebrae to be in normal alignment.  Only minimal degenerative change is noted at the C5-6 level.  No prevertebral soft tissue swelling is seen.  The hypopharyngeal airway appears normal.  The epiglottis is normal in size.  No opaque foreign body is seen. IMPRESSION: 1.  Straightened alignment of the cervical vertebrae with mild degenerative disc disease at C5-6. 2.  The epiglottis and airway appear normal.  No opaque foreign body. Original Report Authenticated By: Joretta Bachelor, M.D.   Assessment & Plan:   There are no diagnoses linked to this encounter. I have discontinued Mr. Nowotny's losartan-hydrochlorothiazide. I am also having him maintain his Cholecalciferol, aspirin, MEGARED OMEGA-3 KRILL OIL, triamcinolone ointment, and omeprazole.  No orders of the defined types were placed in this encounter.    Follow-up: No Follow-up on file.  Walker Kehr, MD

## 2016-11-18 NOTE — Assessment & Plan Note (Signed)
Vit D 

## 2016-11-18 NOTE — Assessment & Plan Note (Signed)
We discussed age appropriate health related issues, including available/recomended screening tests and vaccinations. We discussed a need for adhering to healthy diet and exercise. Labs were ordered to be later reviewed . All questions were answered.   

## 2016-12-13 ENCOUNTER — Telehealth: Payer: Self-pay

## 2016-12-13 NOTE — Telephone Encounter (Signed)
Left message advising patient that he can call back to schedule nurse visit to get his first shingrix vaccine---if patient calls to schedule, let tamara know so that vaccines can be labeled with patients name and placed in cind[facility] refrigerator

## 2017-02-23 ENCOUNTER — Telehealth: Payer: Self-pay | Admitting: Internal Medicine

## 2017-02-23 NOTE — Telephone Encounter (Signed)
Request refill  for Ambien.

## 2017-02-23 NOTE — Telephone Encounter (Signed)
Copied from [place]. Topic: Quick Communication - See Telephone Encounter >> Feb 23, 2017  8:47 AM Ether Griffins B wrote: CRM for notification. See Telephone encounter for:  Pt needing refill on ambien  02/23/17.

## 2017-02-24 MED ORDER — ZOLPIDEM TARTRATE 5 MG PO TABS: 5.0000 mg | ORAL_TABLET | Freq: Every evening | ORAL | 2 refills | Status: DC | PRN

## 2017-02-24 NOTE — Telephone Encounter (Signed)
MD is out of the office pls advise on refill../lb 

## 2017-02-24 NOTE — Telephone Encounter (Signed)
Done erx 

## 2017-02-24 NOTE — Telephone Encounter (Signed)
Notified pt rx approved and sent to [facility]...Johny Chess

## 2017-03-16 ENCOUNTER — Ambulatory Visit: Payer: BLUE CROSS/BLUE SHIELD | Admitting: Family Medicine

## 2017-03-16 ENCOUNTER — Encounter: Payer: Self-pay | Admitting: Family Medicine

## 2017-03-16 VITALS — BP 118/82 | HR 89 | Temp 98.8°F | Ht 71.0 in | Wt 173.9 lb

## 2017-03-16 DIAGNOSIS — J029 Acute pharyngitis, unspecified: Secondary | ICD-10-CM | POA: Diagnosis not present

## 2017-03-16 LAB — POCT RAPID STREP A (OFFICE): RAPID STREP A SCREEN: NEGATIVE

## 2017-03-16 NOTE — Patient Instructions (Signed)
-  rapid and strep culture   Sore Throat When you have a sore throat, your throat may:  Hurt.  Burn.  Feel irritated.  Feel scratchy.  Many things can cause a sore throat, including:  An infection.  Allergies.  Dryness in the air.  Smoke or pollution.  Gastroesophageal reflux disease (GERD).  A tumor.  A sore throat can be the first sign of another sickness. It can happen with other problems, like coughing or a fever. Most sore throats go away without treatment. Follow these instructions at home:  Take over-the-counter medicines only as told by your doctor.  Drink enough fluids to keep your pee (urine) clear or pale yellow.  Rest when you feel you need to.  To help with pain, try: ? Sipping warm liquids, such as broth, herbal tea, or warm water. ? Eating or drinking cold or frozen liquids, such as frozen ice pops. ? Gargling with a salt-water mixture 3-4 times a day or as needed. To make a salt-water mixture, add -1 tsp of salt in 1 cup of warm water. Mix it until you cannot see the salt anymore. ? Sucking on hard candy or throat lozenges. ? Putting a cool-mist humidifier in your bedroom at night. ? Sitting in the bathroom with the door closed for 5-10 minutes while you run hot water in the shower.  Do not use any tobacco products, such as cigarettes, chewing tobacco, and e-cigarettes. If you need help quitting, ask your doctor. Contact a doctor if:  You have a fever for more than 2-3 days.  You keep having symptoms for more than 2-3 days.  Your throat does not get better in 7 days.  You have a fever and your symptoms suddenly get worse. Get help right away if:  You have trouble breathing.  You cannot swallow fluids, soft foods, or your saliva.  You have swelling in your throat or neck that gets worse.  You keep feeling like you are going to throw up (vomit).  You keep throwing up. This information is not intended to replace advice given to you by your  health care provider. Make sure you discuss any questions you have with your health care provider. Document Released: 12/08/2007 Document Revised: 10/25/2015 Document Reviewed: 12/19/2014 Elsevier Interactive Patient Education  [employer].

## 2017-03-16 NOTE — Progress Notes (Signed)
HPI:  Acute visit for respiratory illness: -started: 2 days ago -symptoms:nasal congestion, very sore throat -denies:fever, SOB, NVD, tooth pain -has tried: nothing -sick contacts/travel/risks: no reported flu, strep or tick exposure  ROS: See pertinent positives and negatives per HPI.  Past Medical History:  Diagnosis Date  . Allergic rhinitis   . CELLULITIS, ARM 04/30/2009   Qualifier: Diagnosis of  By: Doralee Albino    . Eczema   . ED (erectile dysfunction)   . Elevated glucose   . Fatty liver   . GERD (gastroesophageal reflux disease)   . HTN (hypertension)   . Hyperlipidemia   . Internal hemorrhoids with bleeding, Grade 2 prolapse 05/06/2013      . Personal history of colonic adenomas 04/01/2013  . Pneumothorax   . Snoring    ? poss OSA    Past Surgical History:  Procedure Laterality Date  . WISDOM TOOTH EXTRACTION  1990    Family History  Problem Relation Age of Onset  . Diabetes Father   . CAD Father   . Psoriasis Neg Hx   . Colon cancer Neg Hx     Social History   Socioeconomic History  . Marital status: Divorced    Spouse name: None  . Number of children: None  . Years of education: None  . Highest education level: None  Social Needs  . Financial resource strain: None  . Food insecurity - worry: None  . Food insecurity - inability: None  . Transportation needs - medical: None  . Transportation needs - non-medical: None  Occupational History  . None  Tobacco Use  . Smoking status: Former Smoker    Last attempt to quit: 03/14/2006    Years since quitting: 11.0  . Smokeless tobacco: Never Used  Substance and Sexual Activity  . Alcohol use: No    Comment: very little  . Drug use: No  . Sexual activity: Yes  Other Topics Concern  . None  Social History Narrative   Regular Exercise- yes     Current Outpatient Medications:  .  aspirin (ASPIRIN CHILDRENS) 81 MG chewable tablet, Chew 1 tablet (81 mg total) by mouth daily., Disp: 100  tablet, Rfl: 3 .  Cholecalciferol 1000 UNITS capsule, Take 1,000 Units by mouth daily.  , Disp: , Rfl:  .  MEGARED OMEGA-3 KRILL OIL 500 MG CAPS, Take 1 capsule by mouth every morning., Disp: 100 capsule, Rfl: 3 .  nortriptyline (PAMELOR) 10 MG capsule, Take 1 capsule (10 mg total) by mouth at bedtime., Disp: 30 capsule, Rfl: 5 .  omeprazole (PRILOSEC) 20 MG capsule, Take 1 capsule (20 mg total) by mouth daily., Disp: 30 capsule, Rfl: 11 .  triamcinolone ointment (KENALOG) 0.5 %, Apply 1 application topically 2 (two) times daily., Disp: 30 g, Rfl: 2 .  zolpidem (AMBIEN) 5 MG tablet, Take 1 tablet (5 mg total) by mouth at bedtime as needed for sleep., Disp: 30 tablet, Rfl: 2  EXAM:  Vitals:   03/16/17 1609  BP: 118/82  Pulse: 89  Temp: 98.8 F (37.1 C)    Body mass index is 24.25 kg/m.  GENERAL: vitals reviewed and listed above, alert, oriented, appears well hydrated and in no acute distress  HEENT: atraumatic, conjunttiva clear, no obvious abnormalities on inspection of external nose and ears, normal appearance of ear canals and TMs, clear nasal congestion, mild post oropharyngeal erythema with PND, 1+ tonsillar edema w/ erythema, no sinus TTP  NECK: no obvious masses on inspection  LUNGS:  clear to auscultation bilaterally, no wheezes, rales or rhonchi, good air movement,  CV: HRRR, no peripheral edema  MS: moves all extremities without noticeable abnormality  PSYCH: pleasant and cooperative, no obvious depression or anxiety  ASSESSMENT AND PLAN:  Discussed the following assessment and plan:  Sore throat - Plan: POC Rapid Strep A  -given HPI and exam findings today, a serious infection or illness is unlikely. We discussed potential etiologies, with viral or strep pharyngitis being most likely. Rapid strep neg and culture pending - treat accordingly. O/w if neg symptomatic care and return precuations. We discussed treatment side effects, likely course, antibiotic misuse,  transmission, and signs of developing a serious illness. -of course, we advised to return or notify a doctor immediately if symptoms worsen or persist or new concerns arise.    Patient Instructions  -rapid and strep culture   Sore Throat When you have a sore throat, your throat may:  Hurt.  Burn.  Feel irritated.  Feel scratchy.  Many things can cause a sore throat, including:  An infection.  Allergies.  Dryness in the air.  Smoke or pollution.  Gastroesophageal reflux disease (GERD).  A tumor.  A sore throat can be the first sign of another sickness. It can happen with other problems, like coughing or a fever. Most sore throats go away without treatment. Follow these instructions at home:  Take over-the-counter medicines only as told by your doctor.  Drink enough fluids to keep your pee (urine) clear or pale yellow.  Rest when you feel you need to.  To help with pain, try: ? Sipping warm liquids, such as broth, herbal tea, or warm water. ? Eating or drinking cold or frozen liquids, such as frozen ice pops. ? Gargling with a salt-water mixture 3-4 times a day or as needed. To make a salt-water mixture, add -1 tsp of salt in 1 cup of warm water. Mix it until you cannot see the salt anymore. ? Sucking on hard candy or throat lozenges. ? Putting a cool-mist humidifier in your bedroom at night. ? Sitting in the bathroom with the door closed for 5-10 minutes while you run hot water in the shower.  Do not use any tobacco products, such as cigarettes, chewing tobacco, and e-cigarettes. If you need help quitting, ask your doctor. Contact a doctor if:  You have a fever for more than 2-3 days.  You keep having symptoms for more than 2-3 days.  Your throat does not get better in 7 days.  You have a fever and your symptoms suddenly get worse. Get help right away if:  You have trouble breathing.  You cannot swallow fluids, soft foods, or your saliva.  You have  swelling in your throat or neck that gets worse.  You keep feeling like you are going to throw up (vomit).  You keep throwing up. This information is not intended to replace advice given to you by your health care provider. Make sure you discuss any questions you have with your health care provider. Document Released: 12/08/2007 Document Revised: 10/25/2015 Document Reviewed: 12/19/2014 Elsevier Interactive Patient Education  2018 [place]., DO

## 2017-03-16 NOTE — Addendum Note (Signed)
Addended by: Agnes Lawrence on: 03/16/2017 04:30 PM   Modules accepted: Orders

## 2017-03-18 LAB — CULTURE, GROUP A STREP
MICRO NUMBER:: 90008899
SPECIMEN QUALITY: ADEQUATE

## 2017-05-19 ENCOUNTER — Ambulatory Visit: Payer: BLUE CROSS/BLUE SHIELD | Admitting: Internal Medicine

## 2017-05-19 ENCOUNTER — Encounter: Payer: Self-pay | Admitting: Internal Medicine

## 2017-05-19 VITALS — BP 128/74 | HR 73 | Temp 98.2°F | Ht 71.0 in | Wt 178.0 lb

## 2017-05-19 DIAGNOSIS — J019 Acute sinusitis, unspecified: Secondary | ICD-10-CM | POA: Diagnosis not present

## 2017-05-19 DIAGNOSIS — K219 Gastro-esophageal reflux disease without esophagitis: Secondary | ICD-10-CM

## 2017-05-19 DIAGNOSIS — Z23 Encounter for immunization: Secondary | ICD-10-CM

## 2017-05-19 DIAGNOSIS — I10 Essential (primary) hypertension: Secondary | ICD-10-CM

## 2017-05-19 MED ORDER — ZOLPIDEM TARTRATE 5 MG PO TABS: 5.0000 mg | ORAL_TABLET | Freq: Every evening | ORAL | 2 refills | Status: DC | PRN

## 2017-05-19 MED ORDER — AMOXICILLIN-POT CLAVULANATE 875-125 MG PO TABS: 1.0000 | ORAL_TABLET | Freq: Two times a day (BID) | ORAL | 0 refills | Status: DC

## 2017-05-19 MED ORDER — NORTRIPTYLINE HCL 10 MG PO CAPS: 10.0000 mg | ORAL_CAPSULE | Freq: Every day | ORAL | 5 refills | Status: DC

## 2017-05-19 NOTE — Assessment & Plan Note (Signed)
labs

## 2017-05-19 NOTE — Progress Notes (Signed)
Subjective:  Patient ID: Dale Campbell, male    DOB: Oct 02, 1960  Age: 57 y.o. MRN: [id number]  CC: No chief complaint on file.   HPI ATHENS LEBEAU presents for dyslipidemia, insomnia f/u C/o sinusitis - green d/c x 1 mo  Outpatient Medications Prior to Visit  Medication Sig Dispense Refill  . aspirin (ASPIRIN CHILDRENS) 81 MG chewable tablet Chew 1 tablet (81 mg total) by mouth daily. 100 tablet 3  . Cholecalciferol 1000 UNITS capsule Take 1,000 Units by mouth daily.      Marland Kitchen MEGARED OMEGA-3 KRILL OIL 500 MG CAPS Take 1 capsule by mouth every morning. 100 capsule 3  . nortriptyline (PAMELOR) 10 MG capsule Take 1 capsule (10 mg total) by mouth at bedtime. 30 capsule 5  . omeprazole (PRILOSEC) 20 MG capsule Take 1 capsule (20 mg total) by mouth daily. 30 capsule 11  . triamcinolone ointment (KENALOG) 0.5 % Apply 1 application topically 2 (two) times daily. 30 g 2  . zolpidem (AMBIEN) 5 MG tablet Take 1 tablet (5 mg total) by mouth at bedtime as needed for sleep. 30 tablet 2   No facility-administered medications prior to visit.     ROS Review of Systems  Constitutional: Negative for appetite change, fatigue and unexpected weight change.  HENT: Positive for congestion, postnasal drip, rhinorrhea, sinus pressure and sinus pain. Negative for nosebleeds, sneezing, sore throat and trouble swallowing.   Eyes: Negative for itching and visual disturbance.  Respiratory: Negative for cough.   Cardiovascular: Negative for chest pain, palpitations and leg swelling.  Gastrointestinal: Negative for abdominal distention, blood in stool, diarrhea and nausea.  Genitourinary: Negative for frequency and hematuria.  Musculoskeletal: Negative for back pain, gait problem, joint swelling and neck pain.  Skin: Negative for rash.  Neurological: Negative for dizziness, tremors, speech difficulty and weakness.  Psychiatric/Behavioral: Positive for sleep disturbance. Negative for agitation, dysphoric  mood and suicidal ideas. The patient is not nervous/anxious.     Objective:  BP 128/74 (BP Location: Left Arm, Patient Position: Sitting, Cuff Size: Large)   Pulse 73   Temp 98.2 F (36.8 C) (Oral)   Ht 5\' 11"  (1.803 m)   Wt 178 lb (80.7 kg)   SpO2 99%   BMI 24.83 kg/m   BP Readings from Last 3 Encounters:  05/19/17 128/74  03/16/17 118/82  11/18/16 120/74    Wt Readings from Last 3 Encounters:  05/19/17 178 lb (80.7 kg)  03/16/17 173 lb 14.4 oz (78.9 kg)  11/18/16 174 lb (78.9 kg)    Physical Exam  Constitutional: He is oriented to person, place, and time. He appears well-developed. No distress.  NAD  HENT:  Mouth/Throat: Oropharynx is clear and moist.  Eyes: Conjunctivae are normal. Pupils are equal, round, and reactive to light.  Neck: Normal range of motion. No JVD present. No thyromegaly present.  Cardiovascular: Normal rate, regular rhythm, normal heart sounds and intact distal pulses. Exam reveals no gallop and no friction rub.  No murmur heard. Pulmonary/Chest: Effort normal and breath sounds normal. No respiratory distress. He has no wheezes. He has no rales. He exhibits no tenderness.  Abdominal: Soft. Bowel sounds are normal. He exhibits no distension and no mass. There is no tenderness. There is no rebound and no guarding.  Musculoskeletal: Normal range of motion. He exhibits no edema or tenderness.  Lymphadenopathy:    He has no cervical adenopathy.  Neurological: He is alert and oriented to person, place, and time. He has normal reflexes.  No cranial nerve deficit. He exhibits normal muscle tone. He displays a negative Romberg sign. Coordination and gait normal.  Skin: Skin is warm and dry. No rash noted.  Psychiatric: He has a normal mood and affect. His behavior is normal. Judgment and thought content normal.  eryth nares  Lab Results  Component Value Date   WBC 4.6 11/17/2016   HGB 15.6 11/17/2016   HCT 45.5 11/17/2016   PLT 172.0 11/17/2016    GLUCOSE 109 (H) 11/17/2016   CHOL 175 11/17/2016   TRIG 95.0 11/17/2016   HDL 39.30 11/17/2016   LDLDIRECT 135.0 08/05/2014   LDLCALC 117 (H) 11/17/2016   ALT 28 11/17/2016   AST 22 11/17/2016   NA 139 11/17/2016   K 4.0 11/17/2016   CL 103 11/17/2016   CREATININE 1.07 11/17/2016   BUN 12 11/17/2016   CO2 26 11/17/2016   TSH 1.92 11/17/2016   PSA 0.28 11/17/2016   HGBA1C 5.1 05/12/2009    Dg Neck Soft Tissue  Result Date: 04/22/2011 *RADIOLOGY REPORT* Clinical Data: Dull left-sided neck pain for 1 month NECK SOFT TISSUES - 1+ VIEW Comparison: None. Findings: A soft tissue lateral view of the neck shows the cervical vertebrae to be in normal alignment.  Only minimal degenerative change is noted at the C5-6 level.  No prevertebral soft tissue swelling is seen.  The hypopharyngeal airway appears normal.  The epiglottis is normal in size.  No opaque foreign body is seen. IMPRESSION: 1.  Straightened alignment of the cervical vertebrae with mild degenerative disc disease at C5-6. 2.  The epiglottis and airway appear normal.  No opaque foreign body. Original Report Authenticated By: Joretta Bachelor, M.D.   Assessment & Plan:   There are no diagnoses linked to this encounter. I am having Kavaughn D. Cuartas "Shanon Brow" maintain his Cholecalciferol, aspirin, MEGARED OMEGA-3 KRILL OIL, triamcinolone ointment, omeprazole, nortriptyline, and zolpidem.  No orders of the defined types were placed in this encounter.    Follow-up: No Follow-up on file.  Walker Kehr, MD

## 2017-05-19 NOTE — Addendum Note (Signed)
Addended by: Karren Cobble on: 05/19/2017 03:12 PM   Modules accepted: Orders

## 2017-05-19 NOTE — Assessment & Plan Note (Signed)
X 1 month Augmentin x10 d

## 2017-05-19 NOTE — Assessment & Plan Note (Signed)
Prilosec 

## 2017-06-05 ENCOUNTER — Telehealth: Payer: Self-pay | Admitting: Internal Medicine

## 2017-06-05 NOTE — Telephone Encounter (Signed)
Left message for pt to call back with symptoms.

## 2017-06-05 NOTE — Telephone Encounter (Signed)
Copied from [place] [phone]. Topic: Quick Communication - See Telephone Encounter >> Jun 05, 2017  1:04 PM Hewitt Shorts wrote: CRM for notification. See Telephone encounter for: 06/05/17.pt is needing a refill on amoxicillian [facility] [address]   Best number [phone]

## 2017-06-06 NOTE — Telephone Encounter (Signed)
Patient was here on 05/19/2017.

## 2017-06-06 NOTE — Telephone Encounter (Signed)
Please advise 

## 2017-06-06 NOTE — Telephone Encounter (Signed)
Pt returned the call. Pt says that he is having the same symptoms as before. Pt says that he thought that another round of the antibiotic would go ahead and clear him up. Pt says that he is having sinus congestion.

## 2017-06-06 NOTE — Telephone Encounter (Signed)
LOV: 05/19/17 with Dr. Alain Marion. Pt states he is still having sinus congestion and wanted to know if an antibiotic could be called in to [facility] on [address].

## 2017-06-06 NOTE — Telephone Encounter (Signed)
Ok  Z-Pak Thank you

## 2017-06-07 MED ORDER — AZITHROMYCIN 250 MG PO TABS: ORAL_TABLET | ORAL | 0 refills | Status: DC

## 2017-06-07 NOTE — Telephone Encounter (Signed)
RX sent

## 2017-09-05 ENCOUNTER — Encounter: Payer: Self-pay | Admitting: Internal Medicine

## 2017-09-06 ENCOUNTER — Other Ambulatory Visit: Payer: Self-pay | Admitting: Internal Medicine

## 2017-09-06 NOTE — Telephone Encounter (Signed)
Copied from [place] [phone]. Topic: Quick Communication - Rx Refill/Question >> Sep 06, 2017 11:08 AM Margot Ables wrote: Medication: zolpidem - 3 days left, takes 1/day Has the patient contacted their pharmacy? No - pt states controlled Preferred Pharmacy (with phone number or street name): [facility] - [address] [place] [phone] (Phone) [phone] (Fax)  Agent: Please be advised that RX refills may take up to 3 business days. We ask that you follow-up with your pharmacy.

## 2017-09-07 NOTE — Telephone Encounter (Signed)
LOV  05/19/17 Dr. Alain Marion Last refill 05/19/17  #30 with 2 refills

## 2017-09-08 ENCOUNTER — Other Ambulatory Visit: Payer: Self-pay | Admitting: Internal Medicine

## 2017-09-08 MED ORDER — SILDENAFIL CITRATE 100 MG PO TABS: 100.0000 mg | ORAL_TABLET | Freq: Every day | ORAL | 11 refills | Status: DC | PRN

## 2017-09-11 MED ORDER — ZOLPIDEM TARTRATE 5 MG PO TABS: 5.0000 mg | ORAL_TABLET | Freq: Every evening | ORAL | 2 refills | Status: DC | PRN

## 2017-11-14 ENCOUNTER — Other Ambulatory Visit: Payer: Self-pay | Admitting: *Deleted

## 2017-11-14 MED ORDER — OMEPRAZOLE 20 MG PO CPDR: 20.0000 mg | DELAYED_RELEASE_CAPSULE | Freq: Every day | ORAL | 11 refills | Status: DC

## 2017-11-23 ENCOUNTER — Other Ambulatory Visit (INDEPENDENT_AMBULATORY_CARE_PROVIDER_SITE_OTHER): Payer: BLUE CROSS/BLUE SHIELD

## 2017-11-23 DIAGNOSIS — I10 Essential (primary) hypertension: Secondary | ICD-10-CM | POA: Diagnosis not present

## 2017-11-23 DIAGNOSIS — K219 Gastro-esophageal reflux disease without esophagitis: Secondary | ICD-10-CM | POA: Diagnosis not present

## 2017-11-23 LAB — BASIC METABOLIC PANEL
BUN: 14 mg/dL (ref 6–23)
CALCIUM: 9.4 mg/dL (ref 8.4–10.5)
CO2: 25 mEq/L (ref 19–32)
Chloride: 102 mEq/L (ref 96–112)
Creatinine, Ser: 1.04 mg/dL (ref 0.40–1.50)
GFR: 78.29 mL/min (ref >60.00)
GLUCOSE: 108 mg/dL — AB (ref 70–99)
Potassium: 3.9 mEq/L (ref 3.5–5.1)
SODIUM: 137 meq/L (ref 135–145)

## 2017-11-23 LAB — HEPATIC FUNCTION PANEL
ALT: 19 U/L (ref 0–53)
AST: 18 U/L (ref 0–37)
Albumin: 4.3 g/dL (ref 3.5–5.2)
Alkaline Phosphatase: 73 U/L (ref 39–117)
BILIRUBIN DIRECT: 0.1 mg/dL (ref 0.0–0.3)
BILIRUBIN TOTAL: 0.8 mg/dL (ref 0.2–1.2)
TOTAL PROTEIN: 7.2 g/dL (ref 6.0–8.3)

## 2017-11-23 LAB — CBC WITH DIFFERENTIAL/PLATELET
BASOS ABS: 0 10*3/uL (ref 0.0–0.1)
Basophils Relative: 1 % (ref 0.0–3.0)
Eosinophils Absolute: 0.1 10*3/uL (ref 0.0–0.7)
Eosinophils Relative: 2.8 % (ref 0.0–5.0)
HEMATOCRIT: 44.8 % (ref 39.0–52.0)
HEMOGLOBIN: 15.8 g/dL (ref 13.0–17.0)
LYMPHS PCT: 42.6 % (ref 12.0–46.0)
Lymphs Abs: 1.5 10*3/uL (ref 0.7–4.0)
MCHC: 35.3 g/dL (ref 30.0–36.0)
MCV: 88.9 fl (ref 78.0–100.0)
MONOS PCT: 9 % (ref 3.0–12.0)
Monocytes Absolute: 0.3 10*3/uL (ref 0.1–1.0)
Neutro Abs: 1.5 10*3/uL (ref 1.4–7.7)
Neutrophils Relative %: 44.6 % (ref 43.0–77.0)
Platelets: 184 10*3/uL (ref 150.0–400.0)
RBC: 5.05 Mil/uL (ref 4.22–5.81)
RDW: 12.5 % (ref 11.5–15.5)
WBC: 3.4 10*3/uL — ABNORMAL LOW (ref 4.0–10.5)

## 2017-11-23 LAB — URINALYSIS
BILIRUBIN URINE: NEGATIVE
Ketones, ur: NEGATIVE
LEUKOCYTES UA: NEGATIVE
NITRITE: NEGATIVE
PH: 6 (ref 5.0–8.0)
SPECIFIC GRAVITY, URINE: 1.025 (ref 1.000–1.030)
Total Protein, Urine: NEGATIVE
Urine Glucose: NEGATIVE
Urobilinogen, UA: 0.2 (ref 0.0–1.0)

## 2017-11-23 LAB — LIPID PANEL
CHOL/HDL RATIO: 5
Cholesterol: 169 mg/dL (ref 0–200)
HDL: 32.4 mg/dL — ABNORMAL LOW (ref >39.00)
LDL Cholesterol: 113 mg/dL — ABNORMAL HIGH (ref 0–99)
NONHDL: 136.21
Triglycerides: 115 mg/dL (ref 0.0–149.0)
VLDL: 23 mg/dL (ref 0.0–40.0)

## 2017-11-23 LAB — PSA: PSA: 0.26 ng/mL (ref 0.10–4.00)

## 2017-11-23 LAB — TSH: TSH: 1.65 u[IU]/mL (ref 0.35–4.50)

## 2017-11-24 ENCOUNTER — Ambulatory Visit (INDEPENDENT_AMBULATORY_CARE_PROVIDER_SITE_OTHER): Payer: BLUE CROSS/BLUE SHIELD | Admitting: Internal Medicine

## 2017-11-24 ENCOUNTER — Encounter: Payer: Self-pay | Admitting: Internal Medicine

## 2017-11-24 VITALS — BP 130/82 | HR 68 | Temp 98.4°F | Ht 71.0 in | Wt 185.0 lb

## 2017-11-24 DIAGNOSIS — Z0000 Encounter for general adult medical examination without abnormal findings: Secondary | ICD-10-CM | POA: Diagnosis not present

## 2017-11-24 DIAGNOSIS — G4709 Other insomnia: Secondary | ICD-10-CM

## 2017-11-24 DIAGNOSIS — Z23 Encounter for immunization: Secondary | ICD-10-CM

## 2017-11-24 MED ORDER — ZOLPIDEM TARTRATE 5 MG PO TABS: 5.0000 mg | ORAL_TABLET | Freq: Every evening | ORAL | 3 refills | Status: DC | PRN

## 2017-11-24 NOTE — Progress Notes (Signed)
Subjective:  Patient ID: Dale Campbell, male    DOB: Jul 14, 1960  Age: 57 y.o. MRN: [id number]  CC: No chief complaint on file.   HPI Dale Campbell presents for well exam C/o arthralgias F/u insomnia  Outpatient Medications Prior to Visit  Medication Sig Dispense Refill  . aspirin (ASPIRIN CHILDRENS) 81 MG chewable tablet Chew 1 tablet (81 mg total) by mouth daily. 100 tablet 3  . Cholecalciferol 1000 UNITS capsule Take 1,000 Units by mouth daily.      Marland Kitchen MEGARED OMEGA-3 KRILL OIL 500 MG CAPS Take 1 capsule by mouth every morning. 100 capsule 3  . nortriptyline (PAMELOR) 10 MG capsule Take 1 capsule (10 mg total) by mouth at bedtime. 30 capsule 5  . omeprazole (PRILOSEC) 20 MG capsule Take 1 capsule (20 mg total) by mouth daily. 30 capsule 11  . sildenafil (VIAGRA) 100 MG tablet Take 1 tablet (100 mg total) by mouth daily as needed for erectile dysfunction. 10 tablet 11  . triamcinolone ointment (KENALOG) 0.5 % Apply 1 application topically 2 (two) times daily. 30 g 2  . zolpidem (AMBIEN) 5 MG tablet Take 1 tablet (5 mg total) by mouth at bedtime as needed for sleep. 30 tablet 2  . amoxicillin-clavulanate (AUGMENTIN) 875-125 MG tablet Take 1 tablet by mouth 2 (two) times daily. 20 tablet 0  . azithromycin (ZITHROMAX) 250 MG tablet Take as directed 6 tablet 0   No facility-administered medications prior to visit.     ROS: Review of Systems  Constitutional: Negative for appetite change, fatigue and unexpected weight change.  HENT: Negative for congestion, nosebleeds, sneezing, sore throat and trouble swallowing.   Eyes: Negative for itching and visual disturbance.  Respiratory: Negative for cough.   Cardiovascular: Negative for chest pain, palpitations and leg swelling.  Gastrointestinal: Negative for abdominal distention, blood in stool, diarrhea and nausea.  Genitourinary: Negative for frequency and hematuria.  Musculoskeletal: Negative for back pain, gait problem, joint  swelling and neck pain.  Skin: Negative for rash.  Neurological: Negative for dizziness, tremors, speech difficulty and weakness.  Psychiatric/Behavioral: Negative for agitation, dysphoric mood and sleep disturbance. The patient is not nervous/anxious.     Objective:  BP 130/82 (BP Location: Left Arm, Patient Position: Sitting, Cuff Size: Normal)   Pulse 68   Temp 98.4 F (36.9 C) (Oral)   Ht 5\' 11"  (1.803 m)   Wt 185 lb (83.9 kg)   SpO2 98%   BMI 25.80 kg/m   BP Readings from Last 3 Encounters:  11/24/17 130/82  05/19/17 128/74  03/16/17 118/82    Wt Readings from Last 3 Encounters:  11/24/17 185 lb (83.9 kg)  05/19/17 178 lb (80.7 kg)  03/16/17 173 lb 14.4 oz (78.9 kg)    Physical Exam  Constitutional: He is oriented to person, place, and time. He appears well-developed. No distress.  NAD  HENT:  Mouth/Throat: Oropharynx is clear and moist.  Eyes: Pupils are equal, round, and reactive to light. Conjunctivae are normal.  Neck: Normal range of motion. No JVD present. No thyromegaly present.  Cardiovascular: Normal rate, regular rhythm, normal heart sounds and intact distal pulses. Exam reveals no gallop and no friction rub.  No murmur heard. Pulmonary/Chest: Effort normal and breath sounds normal. No respiratory distress. He has no wheezes. He has no rales. He exhibits no tenderness.  Abdominal: Soft. Bowel sounds are normal. He exhibits no distension and no mass. There is no tenderness. There is no rebound and no guarding.  Musculoskeletal: Normal range of motion. He exhibits no edema or tenderness.  Lymphadenopathy:    He has no cervical adenopathy.  Neurological: He is alert and oriented to person, place, and time. He has normal reflexes. No cranial nerve deficit. He exhibits normal muscle tone. He displays a negative Romberg sign. Coordination and gait normal.  Skin: Skin is warm and dry. No rash noted.  Psychiatric: He has a normal mood and affect. His behavior is  normal. Judgment and thought content normal.    Lab Results  Component Value Date   WBC 3.4 (L) 11/23/2017   HGB 15.8 11/23/2017   HCT 44.8 11/23/2017   PLT 184.0 11/23/2017   GLUCOSE 108 (H) 11/23/2017   CHOL 169 11/23/2017   TRIG 115.0 11/23/2017   HDL 32.40 (L) 11/23/2017   LDLDIRECT 135.0 08/05/2014   LDLCALC 113 (H) 11/23/2017   ALT 19 11/23/2017   AST 18 11/23/2017   NA 137 11/23/2017   K 3.9 11/23/2017   CL 102 11/23/2017   CREATININE 1.04 11/23/2017   BUN 14 11/23/2017   CO2 25 11/23/2017   TSH 1.65 11/23/2017   PSA 0.26 11/23/2017   HGBA1C 5.1 05/12/2009    Dg Neck Soft Tissue  Result Date: 04/22/2011 *RADIOLOGY REPORT* Clinical Data: Dull left-sided neck pain for 1 month NECK SOFT TISSUES - 1+ VIEW Comparison: None. Findings: A soft tissue lateral view of the neck shows the cervical vertebrae to be in normal alignment.  Only minimal degenerative change is noted at the C5-6 level.  No prevertebral soft tissue swelling is seen.  The hypopharyngeal airway appears normal.  The epiglottis is normal in size.  No opaque foreign body is seen. IMPRESSION: 1.  Straightened alignment of the cervical vertebrae with mild degenerative disc disease at C5-6. 2.  The epiglottis and airway appear normal.  No opaque foreign body. Original Report Authenticated By: Joretta Bachelor, M.D.   Assessment & Plan:   There are no diagnoses linked to this encounter.   No orders of the defined types were placed in this encounter.    Follow-up: No follow-ups on file.  Walker Kehr, MD

## 2017-11-24 NOTE — Assessment & Plan Note (Signed)
Chronic Zolpidem  Potential benefits of a long term benzodiazepines  use as well as potential risks  and complications were explained to the patient and were aknowledged. 

## 2017-11-24 NOTE — Patient Instructions (Addendum)
Valerian root for insomnia 

## 2017-11-24 NOTE — Assessment & Plan Note (Signed)
We discussed age appropriate health related issues, including available/recomended screening tests and vaccinations. We discussed a need for adhering to healthy diet and exercise. Labs were reviewed . All questions were answered. Cardiac CT calcium scoring discussed Shingrix, flu  Colon 1/15 due in 2020

## 2018-03-16 ENCOUNTER — Encounter: Payer: Self-pay | Admitting: Family Medicine

## 2018-03-16 ENCOUNTER — Ambulatory Visit: Payer: BLUE CROSS/BLUE SHIELD | Admitting: Family Medicine

## 2018-03-16 DIAGNOSIS — H109 Unspecified conjunctivitis: Secondary | ICD-10-CM | POA: Insufficient documentation

## 2018-03-16 DIAGNOSIS — H1032 Unspecified acute conjunctivitis, left eye: Secondary | ICD-10-CM

## 2018-03-16 MED ORDER — POLYMYXIN B-TRIMETHOPRIM 10000-0.1 UNIT/ML-% OP SOLN: 1.0000 [drp] | OPHTHALMIC | 0 refills | Status: DC

## 2018-03-16 NOTE — Assessment & Plan Note (Signed)
Polytrim drops q3-4 hours.  Warm/cool compress prn for comfort Hygiene discussed. Call for worsening symptoms or visual changes.

## 2018-03-16 NOTE — Progress Notes (Signed)
Dale Campbell - 58 y.o. male MRN [id number]  Date of birth: 1960/06/12  Subjective Chief Complaint  Patient presents with  . Conjunctivitis    symptoms began one day ago-redness and drainage    HPI  58 y.o. male with burning, redness, discharge and mattering in left eye for 1 days.  He has had some nasal congestion but no other symptoms.  No significant prior ophthalmological history. He does not use contact lenses.  No change in visual acuity, no photophobia, no severe eye pain.  ROS:  A comprehensive ROS was completed and negative except as noted per HPI    Allergies  Allergen Reactions  . Doxycycline     REACTION: rash    Past Medical History:  Diagnosis Date  . Allergic rhinitis   . CELLULITIS, ARM 04/30/2009   Qualifier: Diagnosis of  By: Doralee Albino    . Eczema   . ED (erectile dysfunction)   . Elevated glucose   . Fatty liver   . GERD (gastroesophageal reflux disease)   . HTN (hypertension)   . Hyperlipidemia   . Internal hemorrhoids with bleeding, Grade 2 prolapse 05/06/2013      . Personal history of colonic adenomas 04/01/2013  . Pneumothorax   . Snoring    ? poss OSA    Past Surgical History:  Procedure Laterality Date  . [place] EXTRACTION  1990    Social History   Socioeconomic History  . Marital status: Divorced    Spouse name: Not on file  . Number of children: Not on file  . Years of education: Not on file  . Highest education level: Not on file  Occupational History  . Not on file  Social Needs  . Financial resource strain: Not on file  . Food insecurity:    Worry: Not on file    Inability: Not on file  . Transportation needs:    Medical: Not on file    Non-medical: Not on file  Tobacco Use  . Smoking status: Former Smoker    Last attempt to quit: 03/14/2006    Years since quitting: 12.0  . Smokeless tobacco: Never Used  Substance and Sexual Activity  . Alcohol use: No    Comment: very little  . Drug use: No  . Sexual  activity: Yes  Lifestyle  . Physical activity:    Days per week: Not on file    Minutes per session: Not on file  . Stress: Not on file  Relationships  . Social connections:    Talks on phone: Not on file    Gets together: Not on file    Attends religious service: Not on file    Active member of club or organization: Not on file    Attends meetings of clubs or organizations: Not on file    Relationship status: Not on file  Other Topics Concern  . Not on file  Social History Narrative   Regular Exercise- yes    Family History  Problem Relation Age of Onset  . Diabetes Father   . CAD Father   . Psoriasis Neg Hx   . Colon cancer Neg Hx     Health Maintenance  Topic Date Due  . HIV Screening  02/06/1976  . COLONOSCOPY  04/01/2018  . TETANUS/TDAP  04/21/2021  . INFLUENZA VACCINE  Completed  . Hepatitis C Screening  Completed    ----------------------------------------------------------------------------------------------------------------------------------------------------------------------------------------------------------------- Physical Exam BP 138/84   Pulse 73   Temp 99 F (37.2 C) (  Oral)   Ht 5\' 11"  (1.803 m)   Wt 194 lb (88 kg)   SpO2 99%   BMI 27.06 kg/m   Physical Exam Constitutional:      Appearance: Normal appearance.  HENT:     Head: Normocephalic and atraumatic.     Right Ear: Tympanic membrane normal.     Left Ear: Tympanic membrane normal.     Nose: Congestion present.     Mouth/Throat:     Mouth: Mucous membranes are moist.  Eyes:     General: Lids are normal. No scleral icterus.       Left eye: Discharge (thick, yellow/green) present.    Extraocular Movements: Extraocular movements intact.     Conjunctiva/sclera:     Right eye: Right conjunctiva is not injected. No chemosis.    Left eye: Left conjunctiva is injected. Chemosis present.  Cardiovascular:     Rate and Rhythm: Normal rate and regular rhythm.  Pulmonary:     Effort:  Pulmonary effort is normal.     Breath sounds: Normal breath sounds.  Lymphadenopathy:     Cervical: No cervical adenopathy.  Skin:    General: Skin is warm and dry.  Neurological:     General: No focal deficit present.     Mental Status: He is alert.  Psychiatric:        Mood and Affect: Mood normal.        Behavior: Behavior normal.     ------------------------------------------------------------------------------------------------------------------------------------------------------------------------------------------------------------------- Assessment and Plan  Conjunctivitis Polytrim drops q3-4 hours.  Warm/cool compress prn for comfort Hygiene discussed. Call for worsening symptoms or visual changes.

## 2018-03-16 NOTE — Patient Instructions (Signed)
Bacterial Conjunctivitis, Adult  Bacterial conjunctivitis is an infection of your conjunctiva. This is the clear membrane that covers the white part of your eye and the inner part of your eyelid. This infection can make your eye:  · Red or pink.  · Itchy.  This condition spreads easily from person to person (is contagious) and from one eye to the other eye.  What are the causes?  · This condition is caused by germs (bacteria). You may get the infection if you come into close contact with:  ? A person who has the infection.  ? Items that have germs on them (are contaminated), such as face towels, contact lens solution, or eye makeup.  What increases the risk?  You are more likely to get this condition if you:  · Have contact with people who have the infection.  · Wear contact lenses.  · Have a sinus infection.  · Have had a recent eye injury or surgery.  · Have a weak body defense system (immune system).  · Have dry eyes.  What are the signs or symptoms?    · Thick, yellowish discharge from the eye.  · Tearing or watery eyes.  · Itchy eyes.  · Burning feeling in your eyes.  · Eye redness.  · Swollen eyelids.  · Blurred vision.  How is this treated?    · Antibiotic eye drops or ointment.  · Antibiotic medicine taken by mouth. This is used for infections that do not get better with drops or ointment or that last more than 10 days.  · Cool, wet cloths placed on the eyes.  · Artificial tears used 2-6 times a day.  Follow these instructions at home:  Medicines  · Take or apply your antibiotic medicine as told by your doctor. Do not stop taking or applying the antibiotic even if you start to feel better.  · Take or apply over-the-counter and prescription medicines only as told by your doctor.  · Do not touch your eyelid with the eye-drop bottle or the ointment tube.  Managing discomfort  · Wipe any fluid from your eye with a warm, wet washcloth or a cotton ball.  · Place a clean, cool, wet cloth on your eye. Do this for  10-20 minutes, 3-4 times per day.  General instructions  · Do not wear contacts until the infection is gone. Wear glasses until your doctor says it is okay to wear contacts again.  · Do not wear eye makeup until the infection is gone. Throw away old eye makeup.  · Change or wash your pillowcase every day.  · Do not share towels or washcloths.  · Wash your hands often with soap and water. Use paper towels to dry your hands.  · Do not touch or rub your eyes.  · Do not drive or use heavy machinery if your vision is blurred.  Contact a doctor if:  · You have a fever.  · You do not get better after 10 days.  Get help right away if:  · You have a fever and your symptoms get worse all of a sudden.  · You have very bad pain when you move your eye.  · Your face:  ? Hurts.  ? Is red.  ? Is swollen.  · You have sudden loss of vision.  Summary  · Bacterial conjunctivitis is an infection of your conjunctiva.  · This infection spreads easily from person to person.  · Wash your hands often   with soap and water. Use paper towels to dry your hands.  · Take or apply your antibiotic medicine as told by your doctor.  · Contact a doctor if you have a fever or you do not get better after 10 days.  This information is not intended to replace advice given to you by your health care provider. Make sure you discuss any questions you have with your health care provider.  Document Released: 12/08/2007 Document Revised: 10/04/2017 Document Reviewed: 10/04/2017  Elsevier Interactive Patient Education © 2019 [employer]

## 2018-03-19 ENCOUNTER — Ambulatory Visit: Payer: Self-pay | Admitting: *Deleted

## 2018-03-19 NOTE — Telephone Encounter (Signed)
  Reason for Disposition . Health Information question, no triage required and triager able to answer question  Answer Assessment - Initial Assessment Questions 1. REASON FOR CALL or QUESTION: "What is your reason for calling today?" or "How can I best help you?" or "What question do you have that I can help answer?"     Pt calling to ask how long to use his antibiotic eye drops and if he can also use this for the other eye.  Protocols used: INFORMATION ONLY CALL-A-AH

## 2018-03-19 NOTE — Telephone Encounter (Signed)
Routing to dr plotnikov, please advise, I will call patient back, thanks 

## 2018-03-19 NOTE — Telephone Encounter (Signed)
Pt calling stating he want to know how long should he take the trimethoprim-polymyxin b (POLYTRIM) ophthalmic solution in his lt eye and want to know if its ok to put it in his rt eye now because its spread to that eye also  Called patient regarding the treatment for his left eye.  According to medical literature, he should be using the Polytrim ophthalmic solution for about 7 to 10 days.  He thinks that infection has spread to the the right eye and wants to know if he can use the same eye drops.  His eye is red with a little bit of drainage.  Will route to flow at [facility] at [facility] to review if patient needs to get another prescription for the right eye or use the same bottle. Per his visit summary, he is to come back if he feels like he is not improving. Pt requesting a call back please.

## 2018-03-20 ENCOUNTER — Telehealth: Payer: Self-pay

## 2018-03-20 NOTE — Telephone Encounter (Signed)
Advised patient of dr plotnikov's note/instructions, patient advised this is contagious, if he needs a work note, please call back and ask for tamara,RN at [facility] office

## 2018-03-20 NOTE — Telephone Encounter (Signed)
x7 days; both eyes See an eye doctor if not better Thx

## 2018-03-20 NOTE — Telephone Encounter (Signed)
Copied from [place] [id number]. Topic: General - Other >> Mar 19, 2018  4:12 PM Judyann Munson wrote: Reason for QGB:EEFEOFH is requesting a call back in regards to his triage call on 03-19-2018, requesting a call back from Su Hilt. Please advise >> Mar 20, 2018  8:08 AM Rayann Heman wrote: Pt calling back to check status   See nurse triage note---patient advised of dr plotnikov's note/instructions

## 2018-03-20 NOTE — Telephone Encounter (Signed)
Copied from [place] [id number]. Topic: General - Other >> Mar 19, 2018  4:12 PM Judyann Munson wrote: Reason for QOH:COBTVMT is requesting a call back in regards to his triage call on 03-19-2018, requesting a call back from Su Hilt. Please advise >> Mar 20, 2018  8:08 AM Rayann Heman wrote: Pt calling back to check status   See nurse triage notes, patient advised

## 2018-03-21 NOTE — Telephone Encounter (Signed)
Called and left message advising that letter has been faxed, patient should call back if work does not receive letter

## 2018-03-21 NOTE — Telephone Encounter (Signed)
Pt states he DOES need the work note, as his eyes are not better and still draining.  Please fax the work note to  [phone] Attn: Terry  Pt states the dates he needs to be out are from  03/19/2018- 03/23/2018.

## 2018-03-23 DIAGNOSIS — B308 Other viral conjunctivitis: Secondary | ICD-10-CM | POA: Diagnosis not present

## 2018-03-30 ENCOUNTER — Ambulatory Visit (INDEPENDENT_AMBULATORY_CARE_PROVIDER_SITE_OTHER): Payer: BLUE CROSS/BLUE SHIELD

## 2018-03-30 DIAGNOSIS — Z23 Encounter for immunization: Secondary | ICD-10-CM

## 2018-03-30 DIAGNOSIS — Z299 Encounter for prophylactic measures, unspecified: Secondary | ICD-10-CM

## 2018-04-12 ENCOUNTER — Other Ambulatory Visit: Payer: Self-pay | Admitting: Internal Medicine

## 2018-04-15 MED ORDER — CLOTRIMAZOLE-BETAMETHASONE 1-0.05 % EX CREA: TOPICAL_CREAM | CUTANEOUS | 0 refills | Status: DC

## 2018-05-14 ENCOUNTER — Other Ambulatory Visit: Payer: Self-pay | Admitting: Internal Medicine

## 2018-05-23 ENCOUNTER — Encounter: Payer: Self-pay | Admitting: Internal Medicine

## 2018-05-31 ENCOUNTER — Ambulatory Visit: Payer: BLUE CROSS/BLUE SHIELD | Admitting: Internal Medicine

## 2018-05-31 ENCOUNTER — Other Ambulatory Visit: Payer: Self-pay

## 2018-05-31 ENCOUNTER — Encounter: Payer: Self-pay | Admitting: Internal Medicine

## 2018-05-31 DIAGNOSIS — G4709 Other insomnia: Secondary | ICD-10-CM | POA: Diagnosis not present

## 2018-05-31 DIAGNOSIS — Z0000 Encounter for general adult medical examination without abnormal findings: Secondary | ICD-10-CM | POA: Diagnosis not present

## 2018-05-31 DIAGNOSIS — E785 Hyperlipidemia, unspecified: Secondary | ICD-10-CM | POA: Diagnosis not present

## 2018-05-31 MED ORDER — AZITHROMYCIN 250 MG PO TABS: ORAL_TABLET | ORAL | 0 refills | Status: DC

## 2018-05-31 MED ORDER — ZOLPIDEM TARTRATE 10 MG PO TABS: 10.0000 mg | ORAL_TABLET | Freq: Every evening | ORAL | 1 refills | Status: DC | PRN

## 2018-05-31 NOTE — Assessment & Plan Note (Signed)
A cardiac CT scan for calcium scoring offered 3/20

## 2018-05-31 NOTE — Progress Notes (Signed)
Subjective:  Patient ID: Dale Campbell, male    DOB: February 25, 1961  Age: 58 y.o. MRN: [id number]  CC: No chief complaint on file.   HPI Dale Campbell presents for 6 mo f/u on insomnia. C/o zolpidem 5 mg not helping... C/o sinusitis sx's x 1 month  Outpatient Medications Prior to Visit  Medication Sig Dispense Refill  . aspirin (ASPIRIN CHILDRENS) 81 MG chewable tablet Chew 1 tablet (81 mg total) by mouth daily. 100 tablet 3  . Cholecalciferol 1000 UNITS capsule Take 1,000 Units by mouth daily.      . clotrimazole-betamethasone (LOTRISONE) cream APPLY TO THE AFFECTED AREA TWICE DAILY . USE FOR 2 WEEKS THEN SWITCH TO KETOCONAZOLE CREAM 90 g 0  . MEGARED OMEGA-3 KRILL OIL 500 MG CAPS Take 1 capsule by mouth every morning. 100 capsule 3  . omeprazole (PRILOSEC) 20 MG capsule Take 1 capsule (20 mg total) by mouth daily. 30 capsule 11  . sildenafil (VIAGRA) 100 MG tablet Take 1 tablet (100 mg total) by mouth daily as needed for erectile dysfunction. 10 tablet 11  . triamcinolone ointment (KENALOG) 0.5 % Apply 1 application topically 2 (two) times daily. 30 g 2  . zolpidem (AMBIEN) 5 MG tablet TAKE 1 TABLET BY MOUTH AT BEDTIME AS NEEDED FOR SLEEP 30 tablet 1  . nortriptyline (PAMELOR) 10 MG capsule Take 1 capsule (10 mg total) by mouth at bedtime. 30 capsule 5  . trimethoprim-polymyxin b (POLYTRIM) ophthalmic solution Place 1 drop into the left eye every 4 (four) hours. 10 mL 0   No facility-administered medications prior to visit.     ROS: Review of Systems  Constitutional: Negative for appetite change, fatigue and unexpected weight change.  HENT: Negative for congestion, nosebleeds, sneezing, sore throat and trouble swallowing.   Eyes: Negative for itching and visual disturbance.  Respiratory: Negative for cough.   Cardiovascular: Negative for chest pain, palpitations and leg swelling.  Gastrointestinal: Negative for abdominal distention, blood in stool, diarrhea and nausea.   Genitourinary: Negative for frequency and hematuria.  Musculoskeletal: Negative for back pain, gait problem, joint swelling and neck pain.  Skin: Negative for rash.  Neurological: Negative for dizziness, tremors, speech difficulty and weakness.  Psychiatric/Behavioral: Positive for sleep disturbance. Negative for agitation, dysphoric mood and suicidal ideas. The patient is not nervous/anxious.     Objective:  BP (!) 144/84 (BP Location: Left Arm, Patient Position: Sitting, Cuff Size: Normal)   Pulse 98   Temp 98.3 F (36.8 C) (Oral)   Ht 5\' 11"  (1.803 m)   Wt 194 lb (88 kg)   SpO2 96%   BMI 27.06 kg/m   BP Readings from Last 3 Encounters:  05/31/18 (!) 144/84  03/16/18 138/84  11/24/17 130/82    Wt Readings from Last 3 Encounters:  05/31/18 194 lb (88 kg)  03/16/18 194 lb (88 kg)  11/24/17 185 lb (83.9 kg)    Physical Exam Constitutional:      General: He is not in acute distress.    Appearance: He is well-developed.     Comments: NAD  Eyes:     Conjunctiva/sclera: Conjunctivae normal.     Pupils: Pupils are equal, round, and reactive to light.  Neck:     Musculoskeletal: Normal range of motion.     Thyroid: No thyromegaly.     Vascular: No JVD.  Cardiovascular:     Rate and Rhythm: Normal rate and regular rhythm.     Heart sounds: Normal heart sounds. No murmur.  No friction rub. No gallop.   Pulmonary:     Effort: Pulmonary effort is normal. No respiratory distress.     Breath sounds: Normal breath sounds. No wheezing or rales.  Chest:     Chest wall: No tenderness.  Abdominal:     General: Bowel sounds are normal. There is no distension.     Palpations: Abdomen is soft. There is no mass.     Tenderness: There is no abdominal tenderness. There is no guarding or rebound.  Musculoskeletal: Normal range of motion.        General: No tenderness.  Lymphadenopathy:     Cervical: No cervical adenopathy.  Skin:    General: Skin is warm and dry.     Findings: No  rash.  Neurological:     Mental Status: He is alert and oriented to person, place, and time.     Cranial Nerves: No cranial nerve deficit.     Motor: No abnormal muscle tone.     Coordination: Coordination normal.     Gait: Gait normal.     Deep Tendon Reflexes: Reflexes are normal and symmetric.  Psychiatric:        Behavior: Behavior normal.        Thought Content: Thought content normal.        Judgment: Judgment normal.     Lab Results  Component Value Date   WBC 3.4 (L) 11/23/2017   HGB 15.8 11/23/2017   HCT 44.8 11/23/2017   PLT 184.0 11/23/2017   GLUCOSE 108 (H) 11/23/2017   CHOL 169 11/23/2017   TRIG 115.0 11/23/2017   HDL 32.40 (L) 11/23/2017   LDLDIRECT 135.0 08/05/2014   LDLCALC 113 (H) 11/23/2017   ALT 19 11/23/2017   AST 18 11/23/2017   NA 137 11/23/2017   K 3.9 11/23/2017   CL 102 11/23/2017   CREATININE 1.04 11/23/2017   BUN 14 11/23/2017   CO2 25 11/23/2017   TSH 1.65 11/23/2017   PSA 0.26 11/23/2017   HGBA1C 5.1 05/12/2009    Dg Neck Soft Tissue  Result Date: 04/22/2011 *RADIOLOGY REPORT* Clinical Data: Dull left-sided neck pain for 1 month NECK SOFT TISSUES - 1+ VIEW Comparison: None. Findings: A soft tissue lateral view of the neck shows the cervical vertebrae to be in normal alignment.  Only minimal degenerative change is noted at the C5-6 level.  No prevertebral soft tissue swelling is seen.  The hypopharyngeal airway appears normal.  The epiglottis is normal in size.  No opaque foreign body is seen. IMPRESSION: 1.  Straightened alignment of the cervical vertebrae with mild degenerative disc disease at C5-6. 2.  The epiglottis and airway appear normal.  No opaque foreign body. Original Report Authenticated By: Joretta Bachelor, M.D.   Assessment & Plan:   There are no diagnoses linked to this encounter.   No orders of the defined types were placed in this encounter.    Follow-up: No follow-ups on file.  Walker Kehr, MD

## 2018-05-31 NOTE — Assessment & Plan Note (Signed)
Chronic - worse Zolpidem - increase to 10 mg/d  Potential benefits of a long term benzodiazepines  use as well as potential risks  and complications were explained to the patient and were aknowledged.

## 2018-05-31 NOTE — Patient Instructions (Signed)

## 2018-10-03 ENCOUNTER — Encounter: Payer: Self-pay | Admitting: Internal Medicine

## 2018-10-03 ENCOUNTER — Ambulatory Visit (INDEPENDENT_AMBULATORY_CARE_PROVIDER_SITE_OTHER): Payer: BC Managed Care – PPO | Admitting: Internal Medicine

## 2018-10-03 ENCOUNTER — Other Ambulatory Visit: Payer: Self-pay

## 2018-10-03 DIAGNOSIS — M25511 Pain in right shoulder: Secondary | ICD-10-CM

## 2018-10-03 DIAGNOSIS — M25519 Pain in unspecified shoulder: Secondary | ICD-10-CM | POA: Insufficient documentation

## 2018-10-03 MED ORDER — MELOXICAM 15 MG PO TABS: 15.0000 mg | ORAL_TABLET | Freq: Every day | ORAL | 1 refills | Status: DC

## 2018-10-03 MED ORDER — METHYLPREDNISOLONE ACETATE 40 MG/ML IJ SUSP
40.0000 mg | Freq: Once | INTRAMUSCULAR | Status: AC
  Administered 2018-10-03: 11:00:00 40 mg via INTRA_ARTICULAR

## 2018-10-03 NOTE — Addendum Note (Signed)
Addended by: Cresenciano Lick on: 10/03/2018 11:18 AM   Modules accepted: Orders

## 2018-10-03 NOTE — Progress Notes (Signed)
Subjective:  Patient ID: Dale Campbell, male    DOB: September 02, 1960  Age: 58 y.o. MRN: [id number]  CC: Shoulder Pain (Right x 4 weeks)   HPI Dale Campbell presents for R shoulder pain x 4 wks. No injury. There is a throbbing pain; hard to raise arm, extend; there is a popping sound...   Outpatient Medications Prior to Visit  Medication Sig Dispense Refill  . aspirin (ASPIRIN CHILDRENS) 81 MG chewable tablet Chew 1 tablet (81 mg total) by mouth daily. 100 tablet 3  . azithromycin (ZITHROMAX Z-PAK) 250 MG tablet As directed 6 tablet 0  . Cholecalciferol 1000 UNITS capsule Take 1,000 Units by mouth daily.      . clotrimazole-betamethasone (LOTRISONE) cream APPLY TO THE AFFECTED AREA TWICE DAILY . USE FOR 2 WEEKS THEN SWITCH TO KETOCONAZOLE CREAM 90 g 0  . MEGARED OMEGA-3 KRILL OIL 500 MG CAPS Take 1 capsule by mouth every morning. 100 capsule 3  . omeprazole (PRILOSEC) 20 MG capsule Take 1 capsule (20 mg total) by mouth daily. 30 capsule 11  . sildenafil (VIAGRA) 100 MG tablet Take 1 tablet (100 mg total) by mouth daily as needed for erectile dysfunction. 10 tablet 11  . triamcinolone ointment (KENALOG) 0.5 % Apply 1 application topically 2 (two) times daily. 30 g 2  . nortriptyline (PAMELOR) 10 MG capsule Take 1 capsule (10 mg total) by mouth at bedtime. 30 capsule 5  . zolpidem (AMBIEN) 10 MG tablet Take 1 tablet (10 mg total) by mouth at bedtime as needed for up to 30 days for sleep. 90 tablet 1   No facility-administered medications prior to visit.     ROS: Review of Systems  Constitutional: Negative for appetite change, fatigue and unexpected weight change.  HENT: Negative for congestion, nosebleeds, sneezing, sore throat and trouble swallowing.   Eyes: Negative for itching and visual disturbance.  Respiratory: Negative for cough.   Cardiovascular: Negative for chest pain, palpitations and leg swelling.  Gastrointestinal: Negative for abdominal distention, blood in stool,  diarrhea and nausea.  Genitourinary: Negative for frequency and hematuria.  Musculoskeletal: Positive for arthralgias. Negative for back pain, gait problem, joint swelling and neck pain.  Skin: Negative for rash.  Neurological: Negative for dizziness, tremors, speech difficulty and weakness.  Psychiatric/Behavioral: Negative for agitation, dysphoric mood and sleep disturbance. The patient is not nervous/anxious.     Objective:  BP (!) 150/90 (BP Location: Left Arm, Patient Position: Sitting, Cuff Size: Normal)   Pulse 86   Temp 98.2 F (36.8 C) (Oral)   Ht 5\' 11"  (1.803 m)   Wt 184 lb (83.5 kg)   SpO2 97%   BMI 25.66 kg/m   BP Readings from Last 3 Encounters:  10/03/18 (!) 150/90  05/31/18 (!) 144/84  03/16/18 138/84    Wt Readings from Last 3 Encounters:  10/03/18 184 lb (83.5 kg)  05/31/18 194 lb (88 kg)  03/16/18 194 lb (88 kg)    Physical Exam Constitutional:      General: He is not in acute distress.    Appearance: He is well-developed.     Comments: NAD  Eyes:     Conjunctiva/sclera: Conjunctivae normal.     Pupils: Pupils are equal, round, and reactive to light.  Neck:     Musculoskeletal: Normal range of motion.     Thyroid: No thyromegaly.     Vascular: No JVD.  Cardiovascular:     Rate and Rhythm: Normal rate and regular rhythm.  Heart sounds: Normal heart sounds. No murmur. No friction rub. No gallop.   Pulmonary:     Effort: Pulmonary effort is normal. No respiratory distress.     Breath sounds: Normal breath sounds. No wheezing or rales.  Chest:     Chest wall: No tenderness.  Abdominal:     General: Bowel sounds are normal. There is no distension.     Palpations: Abdomen is soft. There is no mass.     Tenderness: There is no abdominal tenderness. There is no guarding or rebound.  Musculoskeletal: Normal range of motion.        General: Tenderness present.  Lymphadenopathy:     Cervical: No cervical adenopathy.  Skin:    General: Skin is  warm and dry.     Findings: No rash.  Neurological:     Mental Status: He is alert and oriented to person, place, and time.     Cranial Nerves: No cranial nerve deficit.     Motor: No abnormal muscle tone.     Coordination: Coordination normal.     Gait: Gait normal.     Deep Tendon Reflexes: Reflexes are normal and symmetric.  Psychiatric:        Behavior: Behavior normal.        Thought Content: Thought content normal.        Judgment: Judgment normal.     R shoulder - painful   Procedure :Joint Injection,  R shoulder   Indication:  Subacromial bursitis with refractory  chronic pain.   Risks including unsuccessful procedure , bleeding, infection, bruising, skin atrophy, "steroid flare-up" and others were explained to the patient in detail as well as the benefits. Informed consent was obtained and signed.   Tthe patient was placed in a comfortable position. Lateral approach was used. Skin was prepped with Betadine and alcohol  and anesthetized with a cooling spray. Then, a 5 cc syringe with a 2 inch long 24-gauge needle was used for a joint injection.. The needle was advanced  Into the subacromial space.The bursa was injected with 3 mL of 2% lidocaine and 40 mg of Depo-Medrol .  Band-Aid was applied.   Tolerated well. Complications: None. Good pain relief following the procedure.    Lab Results  Component Value Date   WBC 3.4 (L) 11/23/2017   HGB 15.8 11/23/2017   HCT 44.8 11/23/2017   PLT 184.0 11/23/2017   GLUCOSE 108 (H) 11/23/2017   CHOL 169 11/23/2017   TRIG 115.0 11/23/2017   HDL 32.40 (L) 11/23/2017   LDLDIRECT 135.0 08/05/2014   LDLCALC 113 (H) 11/23/2017   ALT 19 11/23/2017   AST 18 11/23/2017   NA 137 11/23/2017   K 3.9 11/23/2017   CL 102 11/23/2017   CREATININE 1.04 11/23/2017   BUN 14 11/23/2017   CO2 25 11/23/2017   TSH 1.65 11/23/2017   PSA 0.26 11/23/2017   HGBA1C 5.1 05/12/2009    Dg Neck Soft Tissue  Result Date: 04/22/2011 *RADIOLOGY REPORT*  Clinical Data: Dull left-sided neck pain for 1 month NECK SOFT TISSUES - 1+ VIEW Comparison: None. Findings: A soft tissue lateral view of the neck shows the cervical vertebrae to be in normal alignment.  Only minimal degenerative change is noted at the C5-6 level.  No prevertebral soft tissue swelling is seen.  The hypopharyngeal airway appears normal.  The epiglottis is normal in size.  No opaque foreign body is seen. IMPRESSION: 1.  Straightened alignment of the cervical vertebrae with mild degenerative  disc disease at C5-6. 2.  The epiglottis and airway appear normal.  No opaque foreign body. Original Report Authenticated By: Joretta Bachelor, M.D.   Assessment & Plan:   There are no diagnoses linked to this encounter.   No orders of the defined types were placed in this encounter.    Follow-up: No follow-ups on file.  Walker Kehr, MD

## 2018-10-03 NOTE — Assessment & Plan Note (Signed)
R - subacromial bursitis/rotator cuff tear Injection Meloxicam ROM exercises PT offered RTC 6-8 wks

## 2018-10-03 NOTE — Patient Instructions (Addendum)
Rotator Cuff Tendinitis/tear  Rotator cuff tendinitis is inflammation of the tough, cord-like bands that connect muscle to bone (tendons) in the rotator cuff. The rotator cuff includes all of the muscles and tendons that connect the arm to the shoulder. The rotator cuff holds the head of the upper arm bone (humerus) in the cup (fossa) of the shoulder blade (scapula). This condition can lead to a long-lasting (chronic) tear. The tear may be partial or complete. What are the causes? This condition is usually caused by overusing the rotator cuff. What increases the risk? This condition is more likely to develop in athletes and workers who frequently use their shoulder or reach over their heads. This can include activities such as:  Tennis.  Baseball or softball.  Swimming.  Construction work.  Painting. What are the signs or symptoms? Symptoms of this condition include:  Pain spreading (radiating) from the shoulder to the upper arm.  Swelling and tenderness in front of the shoulder.  Pain when reaching, pulling, or lifting the arm above the head.  Pain when lowering the arm from above the head.  Minor pain in the shoulder when resting.  Increased pain in the shoulder at night.  Difficulty placing the arm behind the back. How is this diagnosed? This condition is diagnosed with a medical history and physical exam. Tests may also be done, including:  X-rays.  MRI.  Ultrasounds.  CT or MR arthrogram. During this test, a contrast material is injected and then images are taken. How is this treated? Treatment for this condition depends on the severity of the condition. In less severe cases, treatment may include:  Rest. This may be done with a sling that holds the shoulder still (immobilization). Your health care provider may also recommend avoiding activities that involve lifting your arm over your head.  Icing the shoulder.  Anti-inflammatory medicines, such as aspirin or  ibuprofen. In more severe cases, treatment may include:  Physical therapy.  Steroid injections.  Surgery. Follow these instructions at home: If you have a sling:  Wear the sling as told by your health care provider. Remove it only as told by your health care provider.  Loosen the sling if your fingers tingle, become numb, or turn cold and blue.  Keep the sling clean.  If the sling is not waterproof, do not let it get wet. Remove it, if allowed, or cover it with a watertight covering when you take a bath or shower. Managing pain, stiffness, and swelling  If directed, put ice on the injured area. ? If you have a removable sling, remove it as told by your health care provider. ? Put ice in a plastic bag. ? Place a towel between your skin and the bag. ? Leave the ice on for 20 minutes, 2-3 times a day.  Move your fingers often to avoid stiffness and to lessen swelling.  Raise (elevate) the injured area above the level of your heart while you are lying down.  Find a comfortable sleeping position or sleep on a recliner, if available. Driving  Do not drive or use heavy machinery while taking prescription pain medicine.  Ask your health care provider when it is safe to drive if you have a sling on your arm. Activity  Rest your shoulder as told by your health care provider.  Return to your normal activities as told by your health care provider. Ask your health care provider what activities are safe for you.  Do any exercises or stretches as  told by your health care provider.  If you do repetitive overhead tasks, take small breaks in between and include stretching exercises as told by your health care provider. General instructions  Do not use any products that contain nicotine or tobacco, such as cigarettes and e-cigarettes. These can delay healing. If you need help quitting, ask your health care provider.  Take over-the-counter and prescription medicines only as told by your  health care provider.  Keep all follow-up visits as told by your health care provider. This is important. Contact a health care provider if:  Your pain gets worse.  You have new pain in your arm, hands, or fingers.  Your pain is not relieved with medicine or does not get better after 6 weeks of treatment.  You have cracking sensations when moving your shoulder in certain directions.  You hear a snapping sound after using your shoulder, followed by severe pain and weakness. Get help right away if:  Your arm, hand, or fingers are numb or tingling.  Your arm, hand, or fingers are swollen or painful or they turn white or blue. Summary  Rotator cuff tendinitis is inflammation of the tough, cord-like bands that connect muscle to bone (tendons) in the rotator cuff.  This condition is usually caused by overusing the rotator cuff, which includes all of the muscles and tendons that connect the arm to the shoulder.  This condition is more likely to develop in athletes and workers who frequently use their shoulder or reach over their heads.  Treatment generally includes rest, anti-inflammatory medicines, and icing. In some cases, physical therapy and steroid injections may be needed. In severe cases, surgery may be needed. This information is not intended to replace advice given to you by your health care provider. Make sure you discuss any questions you have with your health care provider. Document Released: 05/21/2003 Document Revised: 06/22/2018 Document Reviewed: 02/15/2016 Elsevier Patient Education  [place].     Postprocedure instructions :    A Band-Aid should be left on for 12 hours. Injection therapy is not a cure itself. It is used in conjunction with other modalities. You can use nonsteroidal anti-inflammatories like ibuprofen , hot and cold compresses. Rest is recommended in the next 24 hours. You need to report immediately  if fever, chills or any signs of infection  develop.

## 2018-11-23 ENCOUNTER — Other Ambulatory Visit (INDEPENDENT_AMBULATORY_CARE_PROVIDER_SITE_OTHER): Payer: BC Managed Care – PPO

## 2018-11-23 DIAGNOSIS — Z0000 Encounter for general adult medical examination without abnormal findings: Secondary | ICD-10-CM

## 2018-11-23 LAB — HEPATIC FUNCTION PANEL
ALT: 23 U/L (ref 0–53)
AST: 19 U/L (ref 0–37)
Albumin: 4.6 g/dL (ref 3.5–5.2)
Alkaline Phosphatase: 59 U/L (ref 39–117)
Bilirubin, Direct: 0.2 mg/dL (ref 0.0–0.3)
Total Bilirubin: 0.9 mg/dL (ref 0.2–1.2)
Total Protein: 7.3 g/dL (ref 6.0–8.3)

## 2018-11-23 LAB — CBC WITH DIFFERENTIAL/PLATELET
Basophils Absolute: 0 10*3/uL (ref 0.0–0.1)
Basophils Relative: 0.8 % (ref 0.0–3.0)
Eosinophils Absolute: 0.1 10*3/uL (ref 0.0–0.7)
Eosinophils Relative: 2.6 % (ref 0.0–5.0)
HCT: 44.8 % (ref 39.0–52.0)
Hemoglobin: 15.9 g/dL (ref 13.0–17.0)
Lymphocytes Relative: 31.1 % (ref 12.0–46.0)
Lymphs Abs: 1.3 10*3/uL (ref 0.7–4.0)
MCHC: 35.4 g/dL (ref 30.0–36.0)
MCV: 93.1 fl (ref 78.0–100.0)
Monocytes Absolute: 0.4 10*3/uL (ref 0.1–1.0)
Monocytes Relative: 9.1 % (ref 3.0–12.0)
Neutro Abs: 2.4 10*3/uL (ref 1.4–7.7)
Neutrophils Relative %: 56.4 % (ref 43.0–77.0)
Platelets: 190 10*3/uL (ref 150.0–400.0)
RBC: 4.82 Mil/uL (ref 4.22–5.81)
RDW: 11.8 % (ref 11.5–15.5)
WBC: 4.3 10*3/uL (ref 4.0–10.5)

## 2018-11-23 LAB — URINALYSIS
Hgb urine dipstick: NEGATIVE
Ketones, ur: NEGATIVE
Leukocytes,Ua: NEGATIVE
Nitrite: NEGATIVE
Specific Gravity, Urine: 1.025 (ref 1.000–1.030)
Total Protein, Urine: NEGATIVE
Urine Glucose: NEGATIVE
Urobilinogen, UA: 1 (ref 0.0–1.0)
pH: 6 (ref 5.0–8.0)

## 2018-11-23 LAB — PSA: PSA: 0.45 ng/mL (ref 0.10–4.00)

## 2018-11-23 LAB — BASIC METABOLIC PANEL
BUN: 13 mg/dL (ref 6–23)
CO2: 27 mEq/L (ref 19–32)
Calcium: 9.7 mg/dL (ref 8.4–10.5)
Chloride: 106 mEq/L (ref 96–112)
Creatinine, Ser: 1.05 mg/dL (ref 0.40–1.50)
GFR: 72.6 mL/min (ref >60.00)
Glucose, Bld: 109 mg/dL — ABNORMAL HIGH (ref 70–99)
Potassium: 3.8 mEq/L (ref 3.5–5.1)
Sodium: 142 mEq/L (ref 135–145)

## 2018-11-23 LAB — LIPID PANEL
Cholesterol: 194 mg/dL (ref 0–200)
HDL: 41.1 mg/dL (ref >39.00)
LDL Cholesterol: 139 mg/dL — ABNORMAL HIGH (ref 0–99)
NonHDL: 153.06
Total CHOL/HDL Ratio: 5
Triglycerides: 72 mg/dL (ref 0.0–149.0)
VLDL: 14.4 mg/dL (ref 0.0–40.0)

## 2018-11-23 LAB — TSH: TSH: 1.32 u[IU]/mL (ref 0.35–4.50)

## 2018-11-26 ENCOUNTER — Other Ambulatory Visit: Payer: Self-pay

## 2018-11-26 ENCOUNTER — Encounter: Payer: Self-pay | Admitting: Internal Medicine

## 2018-11-26 ENCOUNTER — Ambulatory Visit (INDEPENDENT_AMBULATORY_CARE_PROVIDER_SITE_OTHER): Payer: BC Managed Care – PPO | Admitting: Internal Medicine

## 2018-11-26 VITALS — BP 150/82 | HR 74 | Temp 97.9°F | Ht 71.0 in | Wt 185.0 lb

## 2018-11-26 DIAGNOSIS — E785 Hyperlipidemia, unspecified: Secondary | ICD-10-CM

## 2018-11-26 DIAGNOSIS — Z23 Encounter for immunization: Secondary | ICD-10-CM

## 2018-11-26 DIAGNOSIS — Z0000 Encounter for general adult medical examination without abnormal findings: Secondary | ICD-10-CM

## 2018-11-26 DIAGNOSIS — G4709 Other insomnia: Secondary | ICD-10-CM

## 2018-11-26 DIAGNOSIS — G8929 Other chronic pain: Secondary | ICD-10-CM

## 2018-11-26 DIAGNOSIS — K635 Polyp of colon: Secondary | ICD-10-CM | POA: Diagnosis not present

## 2018-11-26 DIAGNOSIS — R635 Abnormal weight gain: Secondary | ICD-10-CM

## 2018-11-26 DIAGNOSIS — I10 Essential (primary) hypertension: Secondary | ICD-10-CM

## 2018-11-26 DIAGNOSIS — M25511 Pain in right shoulder: Secondary | ICD-10-CM

## 2018-11-26 MED ORDER — ZOLPIDEM TARTRATE 10 MG PO TABS: 10.0000 mg | ORAL_TABLET | Freq: Every evening | ORAL | 1 refills | Status: DC | PRN

## 2018-11-26 MED ORDER — DICLOFENAC SODIUM 1 % TD GEL: 1.0000 "application " | Freq: Four times a day (QID) | TRANSDERMAL | 3 refills | Status: DC

## 2018-11-26 MED ORDER — OMEPRAZOLE 20 MG PO CPDR: 20.0000 mg | DELAYED_RELEASE_CAPSULE | Freq: Every day | ORAL | 3 refills | Status: DC

## 2018-11-26 MED ORDER — SILDENAFIL CITRATE 100 MG PO TABS: 100.0000 mg | ORAL_TABLET | Freq: Every day | ORAL | 11 refills | Status: DC | PRN

## 2018-11-26 NOTE — Assessment & Plan Note (Signed)
Wt Readings from Last 3 Encounters:  11/26/18 185 lb (83.9 kg)  10/03/18 184 lb (83.5 kg)  05/31/18 194 lb (88 kg)

## 2018-11-26 NOTE — Assessment & Plan Note (Signed)
Sports med ref Voltaren gel

## 2018-11-26 NOTE — Progress Notes (Signed)
Subjective:  Patient ID: Dale Campbell, male    DOB: May 01, 1960  Age: 58 y.o. MRN: [id number]  CC: No chief complaint on file.   HPI Dale Campbell presents for a well exam C/o insomnia F/u colon polyps  Outpatient Medications Prior to Visit  Medication Sig Dispense Refill  . aspirin (ASPIRIN CHILDRENS) 81 MG chewable tablet Chew 1 tablet (81 mg total) by mouth daily. 100 tablet 3  . azithromycin (ZITHROMAX Z-PAK) 250 MG tablet As directed 6 tablet 0  . Cholecalciferol 1000 UNITS capsule Take 1,000 Units by mouth daily.      . clotrimazole-betamethasone (LOTRISONE) cream APPLY TO THE AFFECTED AREA TWICE DAILY . USE FOR 2 WEEKS THEN SWITCH TO KETOCONAZOLE CREAM 90 g 0  . MEGARED OMEGA-3 KRILL OIL 500 MG CAPS Take 1 capsule by mouth every morning. 100 capsule 3  . meloxicam (MOBIC) 15 MG tablet Take 1 tablet (15 mg total) by mouth daily. 30 tablet 1  . omeprazole (PRILOSEC) 20 MG capsule Take 1 capsule (20 mg total) by mouth daily. 30 capsule 11  . sildenafil (VIAGRA) 100 MG tablet Take 1 tablet (100 mg total) by mouth daily as needed for erectile dysfunction. 10 tablet 11  . triamcinolone ointment (KENALOG) 0.5 % Apply 1 application topically 2 (two) times daily. 30 g 2  . nortriptyline (PAMELOR) 10 MG capsule Take 1 capsule (10 mg total) by mouth at bedtime. 30 capsule 5  . zolpidem (AMBIEN) 10 MG tablet Take 1 tablet (10 mg total) by mouth at bedtime as needed for up to 30 days for sleep. 90 tablet 1   No facility-administered medications prior to visit.     ROS: Review of Systems  Constitutional: Negative for appetite change, fatigue and unexpected weight change.  HENT: Negative for congestion, nosebleeds, sneezing, sore throat and trouble swallowing.   Eyes: Negative for itching and visual disturbance.  Respiratory: Negative for cough.   Cardiovascular: Negative for chest pain, palpitations and leg swelling.  Gastrointestinal: Negative for abdominal distention, blood  in stool, diarrhea and nausea.  Genitourinary: Negative for frequency and hematuria.  Musculoskeletal: Negative for back pain, gait problem, joint swelling and neck pain.  Skin: Negative for rash.  Neurological: Negative for dizziness, tremors, speech difficulty and weakness.  Psychiatric/Behavioral: Positive for sleep disturbance. Negative for agitation and dysphoric mood. The patient is not nervous/anxious.     Objective:  BP (!) 150/82 (BP Location: Left Arm, Patient Position: Sitting, Cuff Size: Normal)   Pulse 74   Temp 97.9 F (36.6 C) (Oral)   Ht 5\' 11"  (1.803 m)   Wt 185 lb (83.9 kg)   SpO2 97%   BMI 25.80 kg/m   BP Readings from Last 3 Encounters:  11/26/18 (!) 150/82  10/03/18 (!) 150/90  05/31/18 (!) 144/84    Wt Readings from Last 3 Encounters:  11/26/18 185 lb (83.9 kg)  10/03/18 184 lb (83.5 kg)  05/31/18 194 lb (88 kg)    Physical Exam Constitutional:      General: He is not in acute distress.    Appearance: He is well-developed.     Comments: NAD  Eyes:     Conjunctiva/sclera: Conjunctivae normal.     Pupils: Pupils are equal, round, and reactive to light.  Neck:     Musculoskeletal: Normal range of motion.     Thyroid: No thyromegaly.     Vascular: No JVD.  Cardiovascular:     Rate and Rhythm: Normal rate and regular rhythm.  Heart sounds: Normal heart sounds. No murmur. No friction rub. No gallop.   Pulmonary:     Effort: Pulmonary effort is normal. No respiratory distress.     Breath sounds: Normal breath sounds. No wheezing or rales.  Chest:     Chest wall: No tenderness.  Abdominal:     General: Bowel sounds are normal. There is no distension.     Palpations: Abdomen is soft. There is no mass.     Tenderness: There is no abdominal tenderness. There is no guarding or rebound.  Genitourinary:    Prostate: Normal.     Rectum: Normal. Guaiac result negative.  Musculoskeletal: Normal range of motion.        General: No tenderness.   Lymphadenopathy:     Cervical: No cervical adenopathy.  Skin:    General: Skin is warm and dry.     Findings: No rash.  Neurological:     Mental Status: He is alert and oriented to person, place, and time.     Cranial Nerves: No cranial nerve deficit.     Motor: No abnormal muscle tone.     Coordination: Coordination normal.     Gait: Gait normal.     Deep Tendon Reflexes: Reflexes are normal and symmetric.  Psychiatric:        Behavior: Behavior normal.        Thought Content: Thought content normal.        Judgment: Judgment normal.     Lab Results  Component Value Date   WBC 4.3 11/23/2018   HGB 15.9 11/23/2018   HCT 44.8 11/23/2018   PLT 190.0 11/23/2018   GLUCOSE 109 (H) 11/23/2018   CHOL 194 11/23/2018   TRIG 72.0 11/23/2018   HDL 41.10 11/23/2018   LDLDIRECT 135.0 08/05/2014   LDLCALC 139 (H) 11/23/2018   ALT 23 11/23/2018   AST 19 11/23/2018   NA 142 11/23/2018   K 3.8 11/23/2018   CL 106 11/23/2018   CREATININE 1.05 11/23/2018   BUN 13 11/23/2018   CO2 27 11/23/2018   TSH 1.32 11/23/2018   PSA 0.45 11/23/2018   HGBA1C 5.1 05/12/2009    Dg Neck Soft Tissue  Result Date: 04/22/2011 *RADIOLOGY REPORT* Clinical Data: Dull left-sided neck pain for 1 month NECK SOFT TISSUES - 1+ VIEW Comparison: None. Findings: A soft tissue lateral view of the neck shows the cervical vertebrae to be in normal alignment.  Only minimal degenerative change is noted at the C5-6 level.  No prevertebral soft tissue swelling is seen.  The hypopharyngeal airway appears normal.  The epiglottis is normal in size.  No opaque foreign body is seen. IMPRESSION: 1.  Straightened alignment of the cervical vertebrae with mild degenerative disc disease at C5-6. 2.  The epiglottis and airway appear normal.  No opaque foreign body. Original Report Authenticated By: Joretta Bachelor, M.D.   Assessment & Plan:   There are no diagnoses linked to this encounter.   No orders of the defined types were  placed in this encounter.    Follow-up: No follow-ups on file.  Walker Kehr, MD

## 2018-11-26 NOTE — Assessment & Plan Note (Signed)
A cardiac CT scan for calcium scoring offered 

## 2018-11-26 NOTE — Patient Instructions (Signed)
  Mediterranean diet is good for you. ([employer] has a typical Mediterranean cuisine menu) The Mediterranean diet is a way of eating based on the traditional cuisine of countries bordering the [place]. While there is no single definition of the Mediterranean diet, it is typically high in vegetables, fruits, whole grains, beans, nut and seeds, and olive oil. The main components of Mediterranean diet include: . Daily consumption of vegetables, fruits, whole grains and healthy fats  . Weekly intake of fish, poultry, beans and eggs  . Moderate portions of dairy products  . Limited intake of red meat Other important elements of the Mediterranean diet are sharing meals with family and friends, enjoying a glass of red wine and being physically active. Health benefits of a Mediterranean diet: A traditional Mediterranean diet consisting of large quantities of fresh fruits and vegetables, nuts, fish and olive oil-coupled with physical activity-can reduce your risk of serious mental and physical health problems by: Preventing heart disease and strokes. Following a Mediterranean diet limits your intake of refined breads, processed foods, and red meat, and encourages drinking red wine instead of hard liquor-all factors that can help prevent heart disease and stroke. Keeping you agile. If you're an older adult, the nutrients gained with a Mediterranean diet may reduce your risk of developing muscle weakness and other signs of frailty by about 70 percent. Reducing the risk of Alzheimer's. Research suggests that the Mediterranean diet may improve cholesterol, blood sugar levels, and overall blood vessel health, which in turn may reduce your risk of Alzheimer's disease or dementia. Halving the risk of Parkinson's disease. The high levels of antioxidants in the Mediterranean diet can prevent cells from undergoing a damaging process called oxidative stress, thereby cutting the risk of Parkinson's disease in  half. Increasing longevity. By reducing your risk of developing heart disease or cancer with the Mediterranean diet, you're reducing your risk of death at any age by 20%. Protecting against type 2 diabetes. A Mediterranean diet is rich in fiber which digests slowly, prevents huge swings in blood sugar, and can help you maintain a healthy weight.   

## 2018-11-26 NOTE — Assessment & Plan Note (Addendum)
Not taking Rx Re-start Losartan HCT if elev BP

## 2018-11-26 NOTE — Assessment & Plan Note (Signed)
Will ref to Dr Carlean Purl Last Colon - 10/2013

## 2018-11-29 NOTE — Addendum Note (Signed)
Addended by: Karren Cobble on: 11/29/2018 09:48 AM   Modules accepted: Orders

## 2018-12-30 NOTE — Progress Notes (Signed)
[facility] [address] [place], [place] [address] Phone: [phone] Subjective:   I Dale Campbell am serving as a Education administrator for Dr. Hulan Saas.  I'm seeing this patient by the request  of:  Plotnikov, Evie Lacks, MD   CC: Right shoulder pain  [id number]  Dale Campbell is a 58 y.o. male coming in with complaint of right shoulder pain. Pain radiates into the hand. Numbness and tingling in the fingertips. History of chronic neck pain. Degenerative.   Onset- 4-5 months  Character- sharp Aggravating factors- flexion, internal/ external rotation, adduction, sleeping Reliving factors-  Therapies tried- ice, heat, topical, patches, oral, injection about a month ago that helped for about 2-3 days  Severity- 9/10 at its worse      Past Medical History:  Diagnosis Date  . Allergic rhinitis   . CELLULITIS, ARM 04/30/2009   Qualifier: Diagnosis of  By: Doralee Albino    . Eczema   . ED (erectile dysfunction)   . Elevated glucose   . Fatty liver   . GERD (gastroesophageal reflux disease)   . HTN (hypertension)   . Hyperlipidemia   . Internal hemorrhoids with bleeding, Grade 2 prolapse 05/06/2013      . Personal history of colonic adenomas 04/01/2013  . Pneumothorax   . Snoring    ? poss OSA   Past Surgical History:  Procedure Laterality Date  . [place] EXTRACTION  1990   Social History   Socioeconomic History  . Marital status: Divorced    Spouse name: Not on file  . Number of children: Not on file  . Years of education: Not on file  . Highest education level: Not on file  Occupational History  . Not on file  Social Needs  . Financial resource strain: Not on file  . Food insecurity    Worry: Not on file    Inability: Not on file  . Transportation needs    Medical: Not on file    Non-medical: Not on file  Tobacco Use  . Smoking status: Former Smoker    Quit date: 03/14/2006    Years since quitting: 12.8  . Smokeless tobacco:  Never Used  Substance and Sexual Activity  . Alcohol use: No    Comment: very little  . Drug use: No  . Sexual activity: Yes  Lifestyle  . Physical activity    Days per week: Not on file    Minutes per session: Not on file  . Stress: Not on file  Relationships  . Social Herbalist on phone: Not on file    Gets together: Not on file    Attends religious service: Not on file    Active member of club or organization: Not on file    Attends meetings of clubs or organizations: Not on file    Relationship status: Not on file  Other Topics Concern  . Not on file  Social History Narrative   Regular Exercise- yes   Allergies  Allergen Reactions  . Doxycycline     REACTION: rash   Family History  Problem Relation Age of Onset  . Diabetes Father   . CAD Father   . Heart disease Father   . Psoriasis Neg Hx   . Colon cancer Neg Hx      Current Outpatient Medications (Cardiovascular):  .  sildenafil (VIAGRA) 100 MG tablet, Take 1 tablet (100 mg total) by mouth daily as needed for erectile  dysfunction. .  nitroGLYCERIN (NITRO-DUR) 0.1 mg/hr patch, 1/4 patch daily   Current Outpatient Medications (Analgesics):  .  aspirin (ASPIRIN CHILDRENS) 81 MG chewable tablet, Chew 1 tablet (81 mg total) by mouth daily.   Current Outpatient Medications (Other):  .  azithromycin (ZITHROMAX Z-PAK) 250 MG tablet, As directed .  Cholecalciferol 1000 UNITS capsule, Take 1,000 Units by mouth daily.   .  clotrimazole-betamethasone (LOTRISONE) cream, APPLY TO THE AFFECTED AREA TWICE DAILY . USE FOR 2 WEEKS THEN SWITCH TO KETOCONAZOLE CREAM .  diclofenac sodium (VOLTAREN) 1 % GEL, Apply 1 application topically 4 (four) times daily. Marland Kitchen  MEGARED OMEGA-3 KRILL OIL 500 MG CAPS, Take 1 capsule by mouth every morning. Marland Kitchen  omeprazole (PRILOSEC) 20 MG capsule, Take 1 capsule (20 mg total) by mouth daily. Marland Kitchen  triamcinolone ointment (KENALOG) 0.5 %, Apply 1 application topically 2 (two) times daily. Marland Kitchen   zolpidem (AMBIEN) 10 MG tablet, Take 1 tablet (10 mg total) by mouth at bedtime as needed for sleep.    Past medical history, social, surgical and family history all reviewed in electronic medical record.  No pertanent information unless stated regarding to the chief complaint.   Review of Systems:  No headache, visual changes, nausea, vomiting, diarrhea, constipation, dizziness, abdominal pain, skin rash, fevers, chills, night sweats, weight loss, swollen lymph nodes, body aches, joint swelling,, chest pain, shortness of breath, mood changes.  Positive muscle aches  Objective  Blood pressure 124/62, pulse 92, height 5\' 11"  (1.803 m), weight 183 lb (83 kg), SpO2 96 %.   General: No apparent distress alert and oriented x3 mood and affect normal, dressed appropriately.  HEENT: Pupils equal, extraocular movements intact  Respiratory: Patient's speak in full sentences and does not appear short of breath  Cardiovascular: No lower extremity edema, non tender, no erythema  Skin: Warm dry intact with no signs of infection or rash on extremities or on axial skeleton.  Abdomen: Soft nontender  Neuro: Cranial nerves II through XII are intact, neurovascularly intact in all extremities with 2+ DTRs and 2+ pulses.  Lymph: No lymphadenopathy of posterior or anterior cervical chain or axillae bilaterally.  Gait normal with good balance and coordination.  MSK:  Non tender with full range of motion and good stability and symmetric strength and tone of  elbows, wrist, hip, knee and ankles bilaterally.  Shoulder: Right Inspection reveals mild atrophy compared to the contralateral side,. Palpation i tender over the acromioclavicular joint and does have some swelling in the area ROM is full in all planes passively. Rotator cuff strength 4 out of 5 compared to the contralateral side signs of impingement with positive Neer and Hawkin's tests, but negative empty can sign. Speeds and Yergason's tests normal.  Positive labral pathology noted with positive O'Brien's Obrien's, negative clunk and good stability. Normal scapular function observed. No painful arc and no drop arm sign. No apprehension sign   MSK US performed of: Right This study was ordered, performed, and interpreted by Charlann Boxer D.O.  Shoulder:   Supraspinatus:  A patient is alert appears to be a fairly large tear and does have what appears to be some type of cystic formation within the glenohumeral joint that extends anteriorly and medially. Subscapularis: Significant hypoechoic changes noted. AC joint:  Capsule distention., geyser sign. Glenohumeral Joint:  Appears normal without effusion. Glenoid Labrum:  Intact without visualized tears. Biceps Tendon: Hypoechoic changes.  Impression: Subacromial bursitis, rotator cuff tear with cystic formation  Procedure: Real-time Ultrasound Guided Injection of  right glenohumeral joint Device: GE Logiq E  Ultrasound guided injection is preferred based studies that show increased duration, increased effect, greater accuracy, decreased procedural pain, increased response rate with ultrasound guided versus blind injection.  Verbal informed consent obtained.  Time-out conducted.  Noted no overlying erythema, induration, or other signs of local infection.  Skin prepped in a sterile fashion.  Local anesthesia: Topical Ethyl chloride.  With sterile technique and under real time ultrasound guidance:  Joint visualized.  23g 1  inch needle inserted posterior approach. Pictures taken for needle placement. Patient did have injection of 2 cc of 1% lidocaine, 2 cc of 0.5% Marcaine, and 1.0 cc of Kenalog 40 mg/dL. Completed without difficulty  Pain immediately resolved suggesting accurate placement of the medication.  Advised to call if fevers/chills, erythema, induration, drainage, or persistent bleeding.  Images permanently stored and available for review in the ultrasound unit.  Impression:  Technically successful ultrasound guided injection.    Impression and Recommendations:     This case required medical decision making of moderate complexity. The above documentation has been reviewed and is accurate and complete Lyndal Pulley, DO       Note: This dictation was prepared with Dragon dictation along with smaller phrase technology. Any transcriptional errors that result from this process are unintentional.

## 2018-12-31 ENCOUNTER — Ambulatory Visit (INDEPENDENT_AMBULATORY_CARE_PROVIDER_SITE_OTHER)
Admission: RE | Admit: 2018-12-31 | Discharge: 2018-12-31 | Disposition: A | Discharge status: Home / Self Care | Payer: BC Managed Care – PPO | Source: Ambulatory Visit | Attending: Family Medicine | Admitting: Family Medicine

## 2018-12-31 ENCOUNTER — Other Ambulatory Visit: Payer: Self-pay

## 2018-12-31 ENCOUNTER — Encounter: Payer: Self-pay | Admitting: Family Medicine

## 2018-12-31 ENCOUNTER — Ambulatory Visit: Payer: Self-pay

## 2018-12-31 ENCOUNTER — Ambulatory Visit: Payer: BC Managed Care – PPO | Admitting: Family Medicine

## 2018-12-31 ENCOUNTER — Telehealth: Payer: Self-pay | Admitting: Internal Medicine

## 2018-12-31 VITALS — BP 124/62 | HR 92 | Ht 71.0 in | Wt 183.0 lb

## 2018-12-31 DIAGNOSIS — G8929 Other chronic pain: Secondary | ICD-10-CM

## 2018-12-31 DIAGNOSIS — M25511 Pain in right shoulder: Secondary | ICD-10-CM

## 2018-12-31 DIAGNOSIS — S46011A Strain of muscle(s) and tendon(s) of the rotator cuff of right shoulder, initial encounter: Secondary | ICD-10-CM

## 2018-12-31 DIAGNOSIS — M75101 Unspecified rotator cuff tear or rupture of right shoulder, not specified as traumatic: Secondary | ICD-10-CM | POA: Insufficient documentation

## 2018-12-31 DIAGNOSIS — M19011 Primary osteoarthritis, right shoulder: Secondary | ICD-10-CM | POA: Diagnosis not present

## 2018-12-31 MED ORDER — NITROGLYCERIN 0.1 MG/HR TD PT24: MEDICATED_PATCH | TRANSDERMAL | 12 refills | Status: DC

## 2018-12-31 NOTE — Assessment & Plan Note (Signed)
Patient given injection today.  Will start nitroglycerin patches, patient is a large tear with likely labral pathology as well.  Will try conservative therapy for the next 6 weeks and if no significant improvement or worsening symptoms or even weakness we need to consider the possibility of MRI for the possibility for surgical intervention.  Follow-up with me again in 6 weeks

## 2018-12-31 NOTE — Telephone Encounter (Signed)
Saw Dale Campbell today

## 2018-12-31 NOTE — Telephone Encounter (Signed)
Pharmacy stated the prescription for nitroGLYCERIN (NITRO-DUR) 0.1 mg/hr patch that they received today said he provider wanted a patch cut to 1/4, but they don't cut patches.  Please advise.

## 2018-12-31 NOTE — Patient Instructions (Addendum)
Good to see you.  Ice 20 minutes 2 times daily. Usually after activity and before bed. Exercises 3 times a week.  Shoulder xray downstairs See me again in 6 weeks Nitroglycerin Protocol   Apply 1/4 nitroglycerin patch to affected area daily.  Change position of patch within the affected area every 24 hours.  You may experience a headache during the first 1-2 weeks of using the patch, these should subside.  If you experience headaches after beginning nitroglycerin patch treatment, you may take your preferred over the counter pain reliever.  Another side effect of the nitroglycerin patch is skin irritation or rash related to patch adhesive.  Please notify our office if you develop more severe headaches or rash, and stop the patch.  Tendon healing with nitroglycerin patch may require 12 to 24 weeks depending on the extent of injury.  Men should not use if taking Viagra, Cialis, or Levitra.   Do not use if you have migraines or rosacea.

## 2019-01-01 ENCOUNTER — Other Ambulatory Visit: Payer: Self-pay

## 2019-01-01 MED ORDER — NITROGLYCERIN 0.1 MG/HR TD PT24: MEDICATED_PATCH | TRANSDERMAL | 12 refills | Status: DC

## 2019-01-01 NOTE — Telephone Encounter (Signed)
Patient has not gotten the nitro patches. Informed him that he is to cut the patches and that I will contact the pharmacy. Patient would like to know what can he take in the meantime for pain. States he was not able to sleep last night.

## 2019-01-02 NOTE — Telephone Encounter (Signed)
Pt called back requesting call back, has questions for Dr. Tamala Julian regarding medication mentioned below. Please advise

## 2019-01-02 NOTE — Telephone Encounter (Signed)
If pain is worsening, based on what we saw on ultrasound we may want to go ahead and order MRI of the shoulder

## 2019-01-07 NOTE — Telephone Encounter (Signed)
Talked to patient. Medication was filled. No further questions.

## 2019-02-19 ENCOUNTER — Ambulatory Visit: Payer: BC Managed Care – PPO | Admitting: Internal Medicine

## 2019-05-24 ENCOUNTER — Other Ambulatory Visit: Payer: Self-pay | Admitting: Internal Medicine

## 2019-08-15 ENCOUNTER — Encounter: Payer: Self-pay | Admitting: Internal Medicine

## 2019-08-15 ENCOUNTER — Other Ambulatory Visit: Payer: Self-pay

## 2019-08-15 ENCOUNTER — Ambulatory Visit: Payer: BC Managed Care – PPO | Admitting: Internal Medicine

## 2019-08-15 DIAGNOSIS — R7989 Other specified abnormal findings of blood chemistry: Secondary | ICD-10-CM | POA: Diagnosis not present

## 2019-08-15 DIAGNOSIS — F419 Anxiety disorder, unspecified: Secondary | ICD-10-CM

## 2019-08-15 DIAGNOSIS — I10 Essential (primary) hypertension: Secondary | ICD-10-CM

## 2019-08-15 DIAGNOSIS — R197 Diarrhea, unspecified: Secondary | ICD-10-CM | POA: Diagnosis not present

## 2019-08-15 DIAGNOSIS — R635 Abnormal weight gain: Secondary | ICD-10-CM | POA: Diagnosis not present

## 2019-08-15 MED ORDER — VALSARTAN 160 MG PO TABS: 160.0000 mg | ORAL_TABLET | Freq: Every day | ORAL | 3 refills | Status: DC

## 2019-08-15 MED ORDER — PAROXETINE HCL 10 MG PO TABS: 10.0000 mg | ORAL_TABLET | Freq: Every day | ORAL | 5 refills | Status: DC

## 2019-08-15 NOTE — Assessment & Plan Note (Signed)
Valsartan 

## 2019-08-15 NOTE — Assessment & Plan Note (Addendum)
Try Paxil low dose

## 2019-08-15 NOTE — Assessment & Plan Note (Signed)
Discussed diet  

## 2019-08-15 NOTE — Patient Instructions (Signed)
Lactose Intolerance, Adult Lactose is a natural sugar that is found in dairy milk and dairy products such as cheese and yogurt. Lactose is digested by lactase, a protein (enzyme) in your small intestine. Some people do not produce enough lactase to digest lactose. This is called lactose intolerance. Lactose intolerance is different from milk allergy, which is a more serious reaction to the protein in milk. What are the causes? Causes of lactose intolerance may include:  Normal aging. The ability to produce lactase may lessen with age, causing lactose intolerance over time.  Being born without the ability to make lactase.  Digestive diseases such as gastroenteritis or inflammatory bowel disease (IBD).  Surgery or injury to your small intestine.  Infection in your intestines.  Certain antibiotic medicines and cancer treatments. What are the signs or symptoms? Lactose intolerance can cause discomfort within 30 minutes to 2 hours after you eat or drink something that contains lactose. Symptoms may include:  Nausea.  Diarrhea.  Cramps or pain in the abdomen.  A full, tight, or painful feeling in the abdomen (bloating).  Gas. How is this diagnosed? This condition may be diagnosed based on:  Your symptoms and medical history.  Lactose tolerance test. This test involves drinking a lactose solution and then having blood tests to measure the amount of glucose in your blood. If your blood glucose level does not go up, it means your body is not able to digest the lactose.  Lactose breath test (hydrogen breath test). This test involves drinking a lactose solution and then exhaling into a type of bag while you digest the solution. Having a lot of hydrogen in your breath can be a sign of lactose intolerance.  Stool acidity test. This involves drinking a lactose solution and then having your stool samples tested for bacteria. Having a lot of bacteria causes stool to be considered acidic, which  is a sign of lactose intolerance. How is this treated? There is no treatment to improve your body's ability to produce lactase. However, you can manage your symptoms at home by:  Limiting or avoiding dairy milk, dairy products, and other sources of lactose.  Taking lactase tablets when you eat milk products. Lactase tablets are over-the-counter medicines that help to improve lactose digestion. You may also add lactase drops to regular milk.  Adjusting your diet, such as drinking lactose-free milk. Lactose tolerance varies from person to person. Some people may be able to eat or drink small amounts of products that contain lactose, and other people may need to avoid all foods and drinks that contain lactose. Talk with your health care provider about what treatment is best for you. Follow these instructions at home:  Limit or avoid foods, beverages, and medicines that contain lactose, as told by your health care provider. Keep track of which foods, beverages, or medicines cause symptoms so you can decide what to avoid in the future.  Read food and medicine labels carefully. Avoid products that contain: ? Lactose. ? Milk solids. ? Casein. ? Whey.  Take over-the-counter and prescription medicines (including lactase tablets) only as told by your health care provider.  If you stop eating and drinking dairy products (eliminate dairy from your diet), make sure to get enough protein, calcium, and vitamin D from other foods. Work with your health care provider or a diet and nutrition specialist (dietitian) to make sure you get enough of those nutrients.  Choose a milk substitute that is fortified with calcium and vitamin D. ? Soy milk   contains high-quality protein. ? Milks that are made from nuts or grains (such as almond milk and rice milk) contain very small amounts of protein.  Keep all follow-up visits as told by your health care provider. This is important. Contact a health care provider  if:  You have no relief from your symptoms after you have eliminated milk products and other sources of lactose. Get help right away if:  You have blood in your stool.  You have severe abdomen (abdominal) pain. Summary  Lactose is a natural sugar that is found in dairy milk and dairy products such as cheese and yogurt. Lactose is digested by lactase, which is a protein (enzyme) in the small intestine.  Some people do not produce enough lactase to digest lactose. This is called lactose intolerance.  Lactose intolerance can cause discomfort within 30 minutes to 2 hours after you eat or drink something that contains lactose.  Limit or avoid foods, beverages, and medicines that contain lactose, as told by your health care provider. This information is not intended to replace advice given to you by your health care provider. Make sure you discuss any questions you have with your health care provider. Document Revised: 02/10/2017 Document Reviewed: 10/14/2016 Elsevier Patient Education  2020 Else[employer] Inc[employer]  

## 2019-08-15 NOTE — Assessment & Plan Note (Signed)
?  lactose intol vs pother Lactose free diet Treat stress

## 2019-08-15 NOTE — Assessment & Plan Note (Signed)
2 vodkas/night

## 2019-08-15 NOTE — Progress Notes (Signed)
Subjective:  Patient ID: Dale Campbell, male    DOB: 1960/11/11  Age: 59 y.o. MRN: [id number]  CC: Fatigue, Diarrhea (x 1 month), and Hypertension   HPI Dale Campbell presents for diarrhea 5 stools/day, stress, wt gain, gas x 1 mo. C/o HTN  Outpatient Medications Prior to Visit  Medication Sig Dispense Refill  . aspirin (ASPIRIN CHILDRENS) 81 MG chewable tablet Chew 1 tablet (81 mg total) by mouth daily. 100 tablet 3  . Cholecalciferol 1000 UNITS capsule Take 1,000 Units by mouth daily.      . clotrimazole-betamethasone (LOTRISONE) cream APPLY TO THE AFFECTED AREA TWICE DAILY . USE FOR 2 WEEKS THEN SWITCH TO KETOCONAZOLE CREAM 90 g 0  . diclofenac sodium (VOLTAREN) 1 % GEL Apply 1 application topically 4 (four) times daily. 100 g 3  . MEGARED OMEGA-3 KRILL OIL 500 MG CAPS Take 1 capsule by mouth every morning. 100 capsule 3  . nitroGLYCERIN (NITRO-DUR) 0.1 mg/hr patch 1/4 patch daily 30 patch 12  . nitroGLYCERIN (NITRO-DUR) 0.1 mg/hr patch 1/4 patch daily 30 patch 12  . omeprazole (PRILOSEC) 20 MG capsule Take 1 capsule (20 mg total) by mouth daily. 90 capsule 3  . sildenafil (VIAGRA) 100 MG tablet Take 1 tablet (100 mg total) by mouth daily as needed for erectile dysfunction. 12 tablet 11  . triamcinolone ointment (KENALOG) 0.5 % Apply 1 application topically 2 (two) times daily. 30 g 2  . zolpidem (AMBIEN) 10 MG tablet TAKE 1 TABLET(10 MG) BY MOUTH AT BEDTIME AS NEEDED FOR SLEEP 90 tablet 1  . azithromycin (ZITHROMAX Z-PAK) 250 MG tablet As directed (Patient not taking: Reported on 08/15/2019) 6 tablet 0   No facility-administered medications prior to visit.    ROS: Review of Systems  Constitutional: Positive for fatigue and unexpected weight change. Negative for appetite change.  HENT: Negative for congestion, nosebleeds, sneezing, sore throat and trouble swallowing.   Eyes: Negative for itching and visual disturbance.  Respiratory: Negative for cough.   Cardiovascular:  Negative for chest pain, palpitations and leg swelling.  Gastrointestinal: Positive for abdominal distention, abdominal pain and diarrhea. Negative for blood in stool, nausea and vomiting.  Genitourinary: Negative for frequency and hematuria.  Musculoskeletal: Negative for back pain, gait problem, joint swelling and neck pain.  Skin: Negative for rash.  Neurological: Negative for dizziness, tremors, speech difficulty and weakness.  Psychiatric/Behavioral: Negative for agitation, dysphoric mood, sleep disturbance and suicidal ideas. The patient is not nervous/anxious.     Objective:  BP (!) 170/102 (BP Location: Left Arm, Patient Position: Sitting, Cuff Size: Normal)   Pulse 86   Temp (!) 97.5 F (36.4 C) (Oral)   Ht 5\' 11"  (1.803 m)   Wt 201 lb (91.2 kg)   SpO2 97%   BMI 28.03 kg/m   BP Readings from Last 3 Encounters:  08/15/19 (!) 170/102  12/31/18 124/62  11/26/18 (!) 150/82    Wt Readings from Last 3 Encounters:  08/15/19 201 lb (91.2 kg)  12/31/18 183 lb (83 kg)  11/26/18 185 lb (83.9 kg)    Physical Exam Constitutional:      General: He is not in acute distress.    Appearance: He is well-developed. He is obese.     Comments: NAD  Eyes:     Conjunctiva/sclera: Conjunctivae normal.     Pupils: Pupils are equal, round, and reactive to light.  Neck:     Thyroid: No thyromegaly.     Vascular: No JVD.  Cardiovascular:  Rate and Rhythm: Normal rate and regular rhythm.     Heart sounds: Normal heart sounds. No murmur. No friction rub. No gallop.   Pulmonary:     Effort: Pulmonary effort is normal. No respiratory distress.     Breath sounds: Normal breath sounds. No wheezing or rales.  Chest:     Chest wall: No tenderness.  Abdominal:     General: Bowel sounds are normal. There is no distension.     Palpations: Abdomen is soft. There is no mass.     Tenderness: There is no abdominal tenderness. There is no guarding or rebound.  Musculoskeletal:        General:  No tenderness. Normal range of motion.     Cervical back: Normal range of motion.  Lymphadenopathy:     Cervical: No cervical adenopathy.  Skin:    General: Skin is warm and dry.     Findings: No rash.  Neurological:     Mental Status: He is alert and oriented to person, place, and time.     Cranial Nerves: No cranial nerve deficit.     Motor: No abnormal muscle tone.     Coordination: Coordination normal.     Gait: Gait normal.     Deep Tendon Reflexes: Reflexes are normal and symmetric.  Psychiatric:        Behavior: Behavior normal.        Thought Content: Thought content normal.        Judgment: Judgment normal.     Lab Results  Component Value Date   WBC 4.3 11/23/2018   HGB 15.9 11/23/2018   HCT 44.8 11/23/2018   PLT 190.0 11/23/2018   GLUCOSE 109 (H) 11/23/2018   CHOL 194 11/23/2018   TRIG 72.0 11/23/2018   HDL 41.10 11/23/2018   LDLDIRECT 135.0 08/05/2014   LDLCALC 139 (H) 11/23/2018   ALT 23 11/23/2018   AST 19 11/23/2018   NA 142 11/23/2018   K 3.8 11/23/2018   CL 106 11/23/2018   CREATININE 1.05 11/23/2018   BUN 13 11/23/2018   CO2 27 11/23/2018   TSH 1.32 11/23/2018   PSA 0.45 11/23/2018   HGBA1C 5.1 05/12/2009    DG Shoulder Right  Result Date: 12/31/2018 CLINICAL DATA:  Pain, no known injury EXAM: RIGHT SHOULDER - 2+ VIEW COMPARISON:  None. FINDINGS: No fracture or dislocation of the right shoulder. There is mild acromioclavicular and glenohumeral arthrosis. The partially imaged right chest is unremarkable. IMPRESSION: No fracture or dislocation of the right shoulder. There is mild acromioclavicular and glenohumeral arthrosis. Electronically Signed   By: Eddie Candle M.D.   On: 12/31/2018 15:35   [place] LIMITED JOINT SPACE STRUCTURES UP RIGHT  Result Date: 01/04/2019 MSK US performed of: Right This study was ordered, performed, and interpreted by Charlann Boxer D.O. Shoulder: Supraspinatus: A patient is alert appears to be a fairly large tear and does have  what appears to be some type of cystic formation within the glenohumeral joint that extends anteriorly and medially. Subscapularis: Significant hypoechoic changes noted. AC joint: Capsule distention., geyser sign. Glenohumeral Joint: Appears normal without effusion. Glenoid Labrum: Intact without visualized tears. Biceps Tendon: Hypoechoic changes. Impression: Subacromial bursitis, rotator cuff tear with cystic formation Procedure: Real-time Ultrasound Guided Injection of right glenohumeral joint Device: GE Logiq E Ultrasound guided injection is preferred based studies that show increased duration, increased effect, greater accuracy, decreased procedural pain, increased response rate with ultrasound guided versus blind injection. Verbal informed consent obtained. Time-out conducted. Noted  no overlying erythema, induration, or other signs of local infection. Skin prepped in a sterile fashion. Local anesthesia: Topical Ethyl chloride. With sterile technique and under real time ultrasound guidance: Joint visualized. 23g 1  inch needle inserted posterior approach. Pictures taken for needle placement. Patient did have injection of 2 cc of 1% lidocaine, 2 cc of 0.5% Marcaine, and 1.0 cc of Kenalog 40 mg/dL. Completed without difficulty Pain immediately resolved suggesting accurate placement of the medication. Advised to call if fevers/chills, erythema, induration, drainage, or persistent bleeding. Images permanently stored and available for review in the ultrasound unit. Impression: Technically successful ultrasound guided injection.    Assessment & Plan:    Walker Kehr, MD

## 2019-09-12 ENCOUNTER — Ambulatory Visit: Payer: BC Managed Care – PPO | Admitting: Internal Medicine

## 2019-11-26 ENCOUNTER — Other Ambulatory Visit: Payer: Self-pay | Admitting: Internal Medicine

## 2019-11-29 NOTE — Telephone Encounter (Signed)
Patient requesting  zolpidem (AMBIEN) 10 MG tablet He doesn't have any pills left refills. Asking if anyone else can fill script

## 2020-02-05 ENCOUNTER — Other Ambulatory Visit: Payer: Self-pay

## 2020-02-05 ENCOUNTER — Ambulatory Visit (INDEPENDENT_AMBULATORY_CARE_PROVIDER_SITE_OTHER): Payer: BC Managed Care – PPO | Admitting: Internal Medicine

## 2020-02-05 ENCOUNTER — Encounter: Payer: Self-pay | Admitting: Internal Medicine

## 2020-02-05 VITALS — BP 118/70 | HR 61 | Temp 98.0°F | Ht 71.0 in | Wt 201.0 lb

## 2020-02-05 DIAGNOSIS — Z23 Encounter for immunization: Secondary | ICD-10-CM | POA: Diagnosis not present

## 2020-02-05 DIAGNOSIS — K635 Polyp of colon: Secondary | ICD-10-CM | POA: Diagnosis not present

## 2020-02-05 DIAGNOSIS — E785 Hyperlipidemia, unspecified: Secondary | ICD-10-CM

## 2020-02-05 DIAGNOSIS — Z0000 Encounter for general adult medical examination without abnormal findings: Secondary | ICD-10-CM | POA: Diagnosis not present

## 2020-02-05 DIAGNOSIS — F419 Anxiety disorder, unspecified: Secondary | ICD-10-CM

## 2020-02-05 MED ORDER — ZOLPIDEM TARTRATE 10 MG PO TABS: ORAL_TABLET | ORAL | 1 refills | Status: DC

## 2020-02-05 MED ORDER — VORTIOXETINE HBR 10 MG PO TABS: 10.0000 mg | ORAL_TABLET | Freq: Every day | ORAL | 1 refills | Status: DC

## 2020-02-05 NOTE — Progress Notes (Signed)
Subjective:  Patient ID: Dale Campbell, male    DOB: [birth year]  Age: 59 y.o. MRN: [id number]  CC: Annual Exam (Flu shot)   HPI Dale Campbell presents for a anxiety, insomnia  C/o forgetfulness, stress at work    Outpatient Medications Prior to Visit  Medication Sig Dispense Refill  . aspirin (ASPIRIN CHILDRENS) 81 MG chewable tablet Chew 1 tablet (81 mg total) by mouth daily. 100 tablet 3  . Cholecalciferol 1000 UNITS capsule Take 1,000 Units by mouth daily.      . clotrimazole-betamethasone (LOTRISONE) cream APPLY TO THE AFFECTED AREA TWICE DAILY . USE FOR 2 WEEKS THEN SWITCH TO KETOCONAZOLE CREAM 90 g 0  . diclofenac sodium (VOLTAREN) 1 % GEL Apply 1 application topically 4 (four) times daily. 100 g 3  . MEGARED OMEGA-3 KRILL OIL 500 MG CAPS Take 1 capsule by mouth every morning. 100 capsule 3  . nitroGLYCERIN (NITRO-DUR) 0.1 mg/hr patch 1/4 patch daily 30 patch 12  . omeprazole (PRILOSEC) 20 MG capsule Take 1 capsule (20 mg total) by mouth daily. 90 capsule 3  . PARoxetine (PAXIL) 10 MG tablet Take 1 tablet (10 mg total) by mouth daily. 30 tablet 5  . sildenafil (VIAGRA) 100 MG tablet Take 1 tablet (100 mg total) by mouth daily as needed for erectile dysfunction. 12 tablet 11  . triamcinolone ointment (KENALOG) 0.5 % Apply 1 application topically 2 (two) times daily. 30 g 2  . valsartan (DIOVAN) 160 MG tablet Take 1 tablet (160 mg total) by mouth daily. 90 tablet 3  . zolpidem (AMBIEN) 10 MG tablet TAKE 1 TABLET(10 MG) BY MOUTH AT BEDTIME AS NEEDED FOR SLEEP 90 tablet 0  . nitroGLYCERIN (NITRO-DUR) 0.1 mg/hr patch 1/4 patch daily 30 patch 12   No facility-administered medications prior to visit.    ROS: Review of Systems  Constitutional: Negative for appetite change, fatigue and unexpected weight change.  HENT: Negative for congestion, nosebleeds, sneezing, sore throat and trouble swallowing.   Eyes: Negative for itching and visual disturbance.  Respiratory:  Negative for cough.   Cardiovascular: Negative for chest pain, palpitations and leg swelling.  Gastrointestinal: Negative for abdominal distention, blood in stool, diarrhea and nausea.  Genitourinary: Negative for frequency and hematuria.  Musculoskeletal: Negative for back pain, gait problem, joint swelling and neck pain.  Skin: Negative for rash.  Neurological: Negative for dizziness, tremors, speech difficulty and weakness.  Psychiatric/Behavioral: Positive for sleep disturbance. Negative for agitation and dysphoric mood. The patient is nervous/anxious.     Objective:  BP 118/70 (BP Location: Left Arm)   Pulse 61   Temp 98 F (36.7 C) (Oral)   Ht 5\' 11"  (1.803 m)   Wt 201 lb (91.2 kg)   SpO2 99%   BMI 28.03 kg/m   BP Readings from Last 3 Encounters:  02/05/20 118/70  08/15/19 (!) 170/102  12/31/18 124/62    Wt Readings from Last 3 Encounters:  02/05/20 201 lb (91.2 kg)  08/15/19 201 lb (91.2 kg)  12/31/18 183 lb (83 kg)    Physical Exam Constitutional:      General: He is not in acute distress.    Appearance: He is well-developed.     Comments: NAD  Eyes:     Conjunctiva/sclera: Conjunctivae normal.     Pupils: Pupils are equal, round, and reactive to light.  Neck:     Thyroid: No thyromegaly.     Vascular: No JVD.  Cardiovascular:     Rate and Rhythm:  Normal rate and regular rhythm.     Heart sounds: Normal heart sounds. No murmur heard.  No friction rub. No gallop.   Pulmonary:     Effort: Pulmonary effort is normal. No respiratory distress.     Breath sounds: Normal breath sounds. No wheezing or rales.  Chest:     Chest wall: No tenderness.  Abdominal:     General: Bowel sounds are normal. There is no distension.     Palpations: Abdomen is soft. There is no mass.     Tenderness: There is no abdominal tenderness. There is no guarding or rebound.  Musculoskeletal:        General: No tenderness. Normal range of motion.     Cervical back: Normal range of  motion.  Lymphadenopathy:     Cervical: No cervical adenopathy.  Skin:    General: Skin is warm and dry.     Findings: No rash.  Neurological:     Mental Status: He is alert and oriented to person, place, and time.     Cranial Nerves: No cranial nerve deficit.     Motor: No abnormal muscle tone.     Coordination: Coordination normal.     Gait: Gait normal.     Deep Tendon Reflexes: Reflexes are normal and symmetric.  Psychiatric:        Behavior: Behavior normal.        Thought Content: Thought content normal.        Judgment: Judgment normal.     Lab Results  Component Value Date   WBC 4.3 11/23/2018   HGB 15.9 11/23/2018   HCT 44.8 11/23/2018   PLT 190.0 11/23/2018   GLUCOSE 109 (H) 11/23/2018   CHOL 194 11/23/2018   TRIG 72.0 11/23/2018   HDL 41.10 11/23/2018   LDLDIRECT 135.0 08/05/2014   LDLCALC 139 (H) 11/23/2018   ALT 23 11/23/2018   AST 19 11/23/2018   NA 142 11/23/2018   K 3.8 11/23/2018   CL 106 11/23/2018   CREATININE 1.05 11/23/2018   BUN 13 11/23/2018   CO2 27 11/23/2018   TSH 1.32 11/23/2018   PSA 0.45 11/23/2018   HGBA1C 5.1 05/12/2009    DG Shoulder Right  Result Date: 12/31/2018 CLINICAL DATA:  Pain, no known injury EXAM: RIGHT SHOULDER - 2+ VIEW COMPARISON:  None. FINDINGS: No fracture or dislocation of the right shoulder. There is mild acromioclavicular and glenohumeral arthrosis. The partially imaged right chest is unremarkable. IMPRESSION: No fracture or dislocation of the right shoulder. There is mild acromioclavicular and glenohumeral arthrosis. Electronically Signed   By: Eddie Candle M.D.   On: 12/31/2018 15:35   [place] LIMITED JOINT SPACE STRUCTURES UP RIGHT  Result Date: 01/04/2019 [facility] US performed of: Right This study was ordered, performed, and interpreted by Charlann Boxer D.O. Shoulder: Supraspinatus: A patient is alert appears to be a fairly large tear and does have what appears to be some type of cystic formation within the glenohumeral  joint that extends anteriorly and medially. Subscapularis: Significant hypoechoic changes noted. AC joint: Capsule distention., geyser sign. Glenohumeral Joint: Appears normal without effusion. Glenoid Labrum: Intact without visualized tears. Biceps Tendon: Hypoechoic changes. Impression: Subacromial bursitis, rotator cuff tear with cystic formation Procedure: Real-time Ultrasound Guided Injection of right glenohumeral joint Device: GE Logiq E Ultrasound guided injection is preferred based studies that show increased duration, increased effect, greater accuracy, decreased procedural pain, increased response rate with ultrasound guided versus blind injection. Verbal informed consent obtained. Time-out conducted. Noted no  overlying erythema, induration, or other signs of local infection. Skin prepped in a sterile fashion. Local anesthesia: Topical Ethyl chloride. With sterile technique and under real time ultrasound guidance: Joint visualized. 23g 1  inch needle inserted posterior approach. Pictures taken for needle placement. Patient did have injection of 2 cc of 1% lidocaine, 2 cc of 0.5% Marcaine, and 1.0 cc of Kenalog 40 mg/dL. Completed without difficulty Pain immediately resolved suggesting accurate placement of the medication. Advised to call if fevers/chills, erythema, induration, drainage, or persistent bleeding. Images permanently stored and available for review in the ultrasound unit. Impression: Technically successful ultrasound guided injection.    Assessment & Plan:    Walker Kehr, MD

## 2020-02-05 NOTE — Assessment & Plan Note (Signed)
11/21 Try Trintellix. D/c Paxil

## 2020-02-05 NOTE — Assessment & Plan Note (Signed)
ASA A cardiac CT scan for calcium scoring offered 3/20

## 2020-02-05 NOTE — Patient Instructions (Signed)
You can try Lion's Mane Mushroom extract or capsules for memory   

## 2020-02-11 ENCOUNTER — Other Ambulatory Visit (INDEPENDENT_AMBULATORY_CARE_PROVIDER_SITE_OTHER): Payer: BC Managed Care – PPO

## 2020-02-11 DIAGNOSIS — Z0000 Encounter for general adult medical examination without abnormal findings: Secondary | ICD-10-CM

## 2020-02-11 LAB — CBC WITH DIFFERENTIAL/PLATELET
Basophils Absolute: 0 10*3/uL (ref 0.0–0.1)
Basophils Relative: 0.9 % (ref 0.0–3.0)
Eosinophils Absolute: 0.1 10*3/uL (ref 0.0–0.7)
Eosinophils Relative: 3 % (ref 0.0–5.0)
HCT: 45.2 % (ref 39.0–52.0)
Hemoglobin: 15.5 g/dL (ref 13.0–17.0)
Lymphocytes Relative: 36.4 % (ref 12.0–46.0)
Lymphs Abs: 1.4 10*3/uL (ref 0.7–4.0)
MCHC: 34.2 g/dL (ref 30.0–36.0)
MCV: 89 fl (ref 78.0–100.0)
Monocytes Absolute: 0.5 10*3/uL (ref 0.1–1.0)
Monocytes Relative: 12.1 % — ABNORMAL HIGH (ref 3.0–12.0)
Neutro Abs: 1.9 10*3/uL (ref 1.4–7.7)
Neutrophils Relative %: 47.6 % (ref 43.0–77.0)
Platelets: 158 10*3/uL (ref 150.0–400.0)
RBC: 5.08 Mil/uL (ref 4.22–5.81)
RDW: 12.4 % (ref 11.5–15.5)
WBC: 3.9 10*3/uL — ABNORMAL LOW (ref 4.0–10.5)

## 2020-02-11 LAB — URINALYSIS
Bilirubin Urine: NEGATIVE
Hgb urine dipstick: NEGATIVE
Ketones, ur: NEGATIVE
Leukocytes,Ua: NEGATIVE
Nitrite: NEGATIVE
Specific Gravity, Urine: >=1.03 — AB (ref 1.000–1.030)
Total Protein, Urine: NEGATIVE
Urine Glucose: NEGATIVE
Urobilinogen, UA: 0.2 (ref 0.0–1.0)
pH: 6 (ref 5.0–8.0)

## 2020-02-11 LAB — COMPREHENSIVE METABOLIC PANEL
ALT: 17 U/L (ref 0–53)
AST: 17 U/L (ref 0–37)
Albumin: 4.4 g/dL (ref 3.5–5.2)
Alkaline Phosphatase: 75 U/L (ref 39–117)
BUN: 15 mg/dL (ref 6–23)
CO2: 28 mEq/L (ref 19–32)
Calcium: 9.3 mg/dL (ref 8.4–10.5)
Chloride: 104 mEq/L (ref 96–112)
Creatinine, Ser: 1.09 mg/dL (ref 0.40–1.50)
GFR: 74.55 mL/min (ref >60.00)
Glucose, Bld: 101 mg/dL — ABNORMAL HIGH (ref 70–99)
Potassium: 4 mEq/L (ref 3.5–5.1)
Sodium: 140 mEq/L (ref 135–145)
Total Bilirubin: 0.7 mg/dL (ref 0.2–1.2)
Total Protein: 7.3 g/dL (ref 6.0–8.3)

## 2020-02-11 LAB — LIPID PANEL
Cholesterol: 181 mg/dL (ref 0–200)
HDL: 34.3 mg/dL — ABNORMAL LOW (ref >39.00)
LDL Cholesterol: 130 mg/dL — ABNORMAL HIGH (ref 0–99)
NonHDL: 146.49
Total CHOL/HDL Ratio: 5
Triglycerides: 80 mg/dL (ref 0.0–149.0)
VLDL: 16 mg/dL (ref 0.0–40.0)

## 2020-02-11 LAB — PSA: PSA: 0.2 ng/mL (ref 0.10–4.00)

## 2020-02-11 LAB — TSH: TSH: 1.3 u[IU]/mL (ref 0.35–4.50)

## 2020-02-13 ENCOUNTER — Telehealth: Payer: BC Managed Care – PPO | Admitting: Internal Medicine

## 2020-04-03 ENCOUNTER — Ambulatory Visit (AMBULATORY_SURGERY_CENTER): Payer: BC Managed Care – PPO | Admitting: *Deleted

## 2020-04-03 VITALS — Ht 71.0 in | Wt 195.0 lb

## 2020-04-03 DIAGNOSIS — Z86010 Personal history of colonic polyps: Secondary | ICD-10-CM

## 2020-04-03 NOTE — Progress Notes (Signed)
No egg or soy allergy known to patient  No issues with past sedation with any surgeries or procedures No intubation problems in the past  No FH of Malignant Hyperthermia No diet pills per patient No home 02 use per patient  No blood thinners per patient  Pt denies issues with constipation  No A fib or A flutter  EMMI video to pt or via MyChart   Pt is fully vaccinated  for Covid   PV completed by phone  Due to the COVID-19 pandemic we are asking patients to follow certain guidelines.  Pt aware of COVID protocols and [facility] guidelines

## 2020-04-21 ENCOUNTER — Encounter: Payer: Self-pay | Admitting: Internal Medicine

## 2020-04-24 ENCOUNTER — Other Ambulatory Visit: Payer: Self-pay | Admitting: Internal Medicine

## 2020-04-24 ENCOUNTER — Encounter: Payer: Self-pay | Admitting: Internal Medicine

## 2020-04-24 ENCOUNTER — Other Ambulatory Visit: Payer: Self-pay

## 2020-04-24 ENCOUNTER — Ambulatory Visit (AMBULATORY_SURGERY_CENTER): Payer: BC Managed Care – PPO | Admitting: Internal Medicine

## 2020-04-24 VITALS — BP 122/74 | HR 74 | Temp 97.5°F | Resp 16 | Ht 71.0 in | Wt 195.0 lb

## 2020-04-24 DIAGNOSIS — D125 Benign neoplasm of sigmoid colon: Secondary | ICD-10-CM | POA: Diagnosis not present

## 2020-04-24 DIAGNOSIS — Z86010 Personal history of colonic polyps: Secondary | ICD-10-CM | POA: Diagnosis not present

## 2020-04-24 DIAGNOSIS — D124 Benign neoplasm of descending colon: Secondary | ICD-10-CM

## 2020-04-24 DIAGNOSIS — Z1211 Encounter for screening for malignant neoplasm of colon: Secondary | ICD-10-CM | POA: Diagnosis not present

## 2020-04-24 MED ORDER — SODIUM CHLORIDE 0.9 % IV SOLN: 500.0000 mL | Freq: Once | INTRAVENOUS | Status: DC

## 2020-04-24 NOTE — Patient Instructions (Addendum)
I found and removed 5 small polyps - all look benign.  There was a rash on perianal skin - suspect is eczema. Can use your treatment for that.  I will let you know pathology results and when to have another routine colonoscopy by mail and/or My Chart.  I appreciate the opportunity to care for you. Gatha Mayer, MD, FACG  YOU HAD AN ENDOSCOPIC PROCEDURE TODAY AT [facility]:   Refer to the procedure report that was given to you for any specific questions about what was found during the examination.  If the procedure report does not answer your questions, please call your gastroenterologist to clarify.  If you requested that your care partner not be given the details of your procedure findings, then the procedure report has been included in a sealed envelope for you to review at your convenience later.  YOU SHOULD EXPECT: Some feelings of bloating in the abdomen. Passage of more gas than usual.  Walking can help get rid of the air that was put into your GI tract during the procedure and reduce the bloating. If you had a lower endoscopy (such as a colonoscopy or flexible sigmoidoscopy) you may notice spotting of blood in your stool or on the toilet paper. If you underwent a bowel prep for your procedure, you may not have a normal bowel movement for a few days.  Please Note:  You might notice some irritation and congestion in your nose or some drainage.  This is from the oxygen used during your procedure.  There is no need for concern and it should clear up in a day or so.  SYMPTOMS TO REPORT IMMEDIATELY:   Following lower endoscopy (colonoscopy or flexible sigmoidoscopy):  Excessive amounts of blood in the stool  Significant tenderness or worsening of abdominal pains  Swelling of the abdomen that is new, acute  Fever of 100F or higher   For urgent or emergent issues, a gastroenterologist can be reached at any hour by calling [phone]. Do not use MyChart messaging for  urgent concerns.    DIET:  We do recommend a small meal at first, but then you may proceed to your regular diet.  Drink plenty of fluids but you should avoid alcoholic beverages for 24 hours.  ACTIVITY:  You should plan to take it easy for the rest of today and you should NOT DRIVE or use heavy machinery until tomorrow (because of the sedation medicines used during the test).    FOLLOW UP: Our staff will call the number listed on your records 48-72 hours following your procedure to check on you and address any questions or concerns that you may have regarding the information given to you following your procedure. If we do not reach you, we will leave a message.  We will attempt to reach you two times.  During this call, we will ask if you have developed any symptoms of COVID 19. If you develop any symptoms (ie: fever, flu-like symptoms, shortness of breath, cough etc.) before then, please call [phone].  If you test positive for Covid 19 in the 2 weeks post procedure, please call and report this information to [place].    If any biopsies were taken you will be contacted by phone or by letter within the next 1-3 weeks.  Please call us at [phone] if you have not heard about the biopsies in 3 weeks.    SIGNATURES/CONFIDENTIALITY: You and/or your care partner have signed paperwork which will  be entered into your electronic medical record.  These signatures attest to the fact that that the information above on your After Visit Summary has been reviewed and is understood.  Full responsibility of the confidentiality of this discharge information lies with you and/or your care-partner. 

## 2020-04-24 NOTE — Progress Notes (Signed)
Called to room to assist during endoscopic procedure.  Patient ID and intended procedure confirmed with present staff. Received instructions for my participation in the procedure from the performing physician.  

## 2020-04-24 NOTE — Progress Notes (Signed)
VS NS ° °Pt's states no medical or surgical changes since previsit or office visit. ° °

## 2020-04-24 NOTE — Op Note (Signed)
[facility] Patient Name: Dale Campbell Procedure Date: 04/24/2020 10:14 AM MRN: [id number] Endoscopist: Gatha Mayer , MD Age: 60 Referring MD:  Date of Birth: 09-04-1960 Gender: Male Account #: [id number] Procedure:                Colonoscopy Indications:              Surveillance: Personal history of adenomatous                            polyps on last colonoscopy > 5 years ago Medicines:                Propofol per Anesthesia, Monitored Anesthesia Care Procedure:                Pre-Anesthesia Assessment:                           - Prior to the procedure, a History and Physical                            was performed, and patient medications and                            allergies were reviewed. The patient's tolerance of                            previous anesthesia was also reviewed. The risks                            and benefits of the procedure and the sedation                            options and risks were discussed with the patient.                            All questions were answered, and informed consent                            was obtained. Prior Anticoagulants: The patient has                            taken no previous anticoagulant or antiplatelet                            agents. ASA Grade Assessment: III - A patient with                            severe systemic disease. After reviewing the risks                            and benefits, the patient was deemed in                            satisfactory condition to undergo the procedure.  After obtaining informed consent, the colonoscope                            was passed under direct vision. Throughout the                            procedure, the patient's blood pressure, pulse, and                            oxygen saturations were monitored continuously. The                            Olympus CF-HQ190 [id number]) Colonoscope was                             introduced through the anus and advanced to the the                            cecum, identified by appendiceal orifice and                            ileocecal valve. The colonoscopy was performed                            without difficulty. The patient tolerated the                            procedure well. The quality of the bowel                            preparation was good. The bowel preparation used                            was Miralax via split dose instruction. The                            ileocecal valve, appendiceal orifice, and rectum                            were photographed. Scope In: 10:30:40 AM Scope Out: 10:47:24 AM Scope Withdrawal Time: 0 hours 13 minutes 22 seconds  Total Procedure Duration: 0 hours 16 minutes 44 seconds  Findings:                 Five sessile and semi-pedunculated polyps were                            found in the sigmoid colon and descending colon.                            The polyps were 1 to 8 mm in size. These polyps                            were removed with a cold snare. Resection and  retrieval were complete. Verification of patient                            identification for the specimen was done. Estimated                            blood loss was minimal.                           The perianal exam findings include a perianal rash.                           The digital rectal exam was normal. Pertinent                            negatives include normal prostate (size, shape, and                            consistency).                           The exam was otherwise without abnormality on                            direct and retroflexion views. Complications:            No immediate complications. Estimated Blood Loss:     Estimated blood loss was minimal. Impression:               2015 two adenomas max 10 mm                           - Five 1 to 8 mm polyps in the sigmoid colon and in                             the descending colon, removed with a cold snare.                            Resected and retrieved.                           - Perianal rash found on perianal exam. eczema I                            suspect (he has hx)                           - The examination was otherwise normal on direct                            and retroflexion views. Recommendation:           - Patient has a contact number available for                            emergencies. The signs and symptoms of potential  delayed complications were discussed with the                            patient. Return to normal activities tomorrow.                            Written discharge instructions were provided to the                            patient.                           - Resume previous diet.                           - Continue present medications.                           - Repeat colonoscopy is recommended for                            surveillance. The colonoscopy date will be                            determined after pathology results from today's                            exam become available for review.                           - topical steroids for eczema prn (already rxed) Gatha Mayer, MD 04/24/2020 10:56:29 AM This report has been signed electronically.

## 2020-04-24 NOTE — Progress Notes (Signed)
pt tolerated well. VSS. awake and to recovery. Report given to RN.  

## 2020-04-28 ENCOUNTER — Telehealth: Payer: Self-pay

## 2020-04-28 NOTE — Telephone Encounter (Signed)
  Follow up Call-  Call back number 04/24/2020  Post procedure Call Back phone  # [phone]  Permission to leave phone message Yes  Some recent data might be hidden     Patient questions:  Do you have a fever, pain , or abdominal swelling? No. Pain Score  0 *  Have you tolerated food without any problems? Yes.    Have you been able to return to your normal activities? Yes.    Do you have any questions about your discharge instructions: Diet   No. Medications  No. Follow up visit  No.  Do you have questions or concerns about your Care? No.  Actions: * If pain score is 4 or above: No action needed, pain <4.  1. Have you developed a fever since your procedure? no  2.   Have you had an respiratory symptoms (SOB or cough) since your procedure? No  3.   Have you tested positive for COVID 19 since your procedure no  4.   Have you had any family members/close contacts diagnosed with the COVID 19 since your procedure?  no   If yes to any of these questions please route to Joylene John, RN and Joella Prince, RN

## 2020-05-07 ENCOUNTER — Ambulatory Visit: Payer: BC Managed Care – PPO | Admitting: Internal Medicine

## 2020-05-12 ENCOUNTER — Encounter: Payer: Self-pay | Admitting: Internal Medicine

## 2020-08-21 ENCOUNTER — Other Ambulatory Visit: Payer: Self-pay | Admitting: Internal Medicine

## 2020-08-21 ENCOUNTER — Encounter: Payer: Self-pay | Admitting: Internal Medicine

## 2020-08-21 ENCOUNTER — Telehealth (INDEPENDENT_AMBULATORY_CARE_PROVIDER_SITE_OTHER): Payer: BC Managed Care – PPO | Admitting: Internal Medicine

## 2020-08-21 DIAGNOSIS — U071 COVID-19: Secondary | ICD-10-CM

## 2020-08-21 MED ORDER — HYDROCOD POLST-CPM POLST ER 10-8 MG/5ML PO SUER: 5.0000 mL | Freq: Two times a day (BID) | ORAL | 0 refills | Status: DC | PRN

## 2020-08-21 NOTE — Telephone Encounter (Signed)
Zolpidem  Last Visit: 02/05/20 Next Visit: none Last Filled: 05/18/20  PMP done; please advise

## 2020-08-21 NOTE — Progress Notes (Signed)
Virtual Visit via Video Note  I connected with Dale Campbell on 08/21/20 at  2:40 PM EDT by a video enabled telemedicine application and verified that I am speaking with the correct person using two identifiers.   I discussed the limitations of evaluation and management by telemedicine and the availability of in person appointments. The patient expressed understanding and agreed to proceed.  Present for the visit:  Myself, Dr Billey Gosling, Dale Campbell.  The patient is currently at home and I am in the office.    No referring provider.    History of Present Illness: This is an acute visit for COVID.  His symptoms started last Sunday. He thought it was a cold.  He still has nasal congestion, cough, runny nose and fatigue.  He was at the beach so did not test until yesterday and found out he was positive.  He denies any other concerning symptoms.  His symptoms are slightly better.    He has been taking mucinex.    Has had 3 covid vaccines   Review of Systems  Constitutional:  Positive for malaise/fatigue. Negative for fever.  HENT:  Positive for congestion. Negative for sore throat.        Runny nose,  No change in taste/smell  Respiratory:  Positive for cough. Negative for sputum production, shortness of breath and wheezing.   Gastrointestinal:  Negative for diarrhea and nausea.  Neurological:  Negative for dizziness and headaches.     Social History   Socioeconomic History   Marital status: Divorced    Spouse name: Not on file   Number of children: Not on file   Years of education: Not on file   Highest education level: Not on file  Occupational History    Employer: [employer]  Tobacco Use   Smoking status: Former    Pack years: 0.00    Types: Cigarettes    Quit date: 03/14/2006    Years since quitting: 14.4   Smokeless tobacco: Never  Vaping Use   Vaping Use: Never used  Substance and Sexual Activity   Alcohol use: No    Comment: occasionally    Drug use: No   Sexual activity: Yes  Other Topics Concern   Not on file  Social History Narrative   Regular Exercise- yes   Social Determinants of Health   Financial Resource Strain: Not on file  Food Insecurity: Not on file  Transportation Needs: Not on file  Physical Activity: Not on file  Stress: Not on file  Social Connections: Not on file     Observations/Objective: Appears well in NAD Breathing normal   Assessment and Plan:  COVID: Acute Symptoms started 6 days ago and are mild in nature He has started to see some improvement in his symptoms He is low risk so not a candidate for an antiviral and he is also out of the window for the antiviral Discussed symptomatic treatment with over-the-counter cold medications Will prescribe Tussionex cough syrup 5 mL every 12 hours as needed for cough Discussed quarantine and returning to work He will call with any questions or concerns   Follow Up Instructions:    I discussed the assessment and treatment plan with the patient. The patient was provided an opportunity to ask questions and all were answered. The patient agreed with the plan and demonstrated an understanding of the instructions.   The patient was advised to call back or seek an in-person evaluation if the symptoms worsen or if  the condition fails to improve as anticipated.    Binnie Rail, MD

## 2020-10-05 ENCOUNTER — Ambulatory Visit: Payer: BC Managed Care – PPO | Admitting: Internal Medicine

## 2020-10-05 ENCOUNTER — Other Ambulatory Visit: Payer: Self-pay

## 2020-10-05 VITALS — BP 162/90 | HR 74 | Temp 98.2°F | Resp 96 | Ht 71.0 in | Wt 202.0 lb

## 2020-10-05 DIAGNOSIS — J309 Allergic rhinitis, unspecified: Secondary | ICD-10-CM

## 2020-10-05 DIAGNOSIS — H6690 Otitis media, unspecified, unspecified ear: Secondary | ICD-10-CM

## 2020-10-05 DIAGNOSIS — H6992 Unspecified Eustachian tube disorder, left ear: Secondary | ICD-10-CM

## 2020-10-05 MED ORDER — GUAIFENESIN ER 600 MG PO TB12: 1200.0000 mg | ORAL_TABLET | Freq: Two times a day (BID) | ORAL | 1 refills | Status: DC | PRN

## 2020-10-05 MED ORDER — LEVOFLOXACIN 500 MG PO TABS: 500.0000 mg | ORAL_TABLET | Freq: Every day | ORAL | 0 refills | Status: AC

## 2020-10-05 MED ORDER — PREDNISONE 10 MG PO TABS: ORAL_TABLET | ORAL | 0 refills | Status: DC

## 2020-10-05 MED ORDER — METHYLPREDNISOLONE ACETATE 80 MG/ML IJ SUSP
80.0000 mg | Freq: Once | INTRAMUSCULAR | Status: AC
Start: 2020-10-05 — End: 2020-10-05
  Administered 2020-10-05: 80 mg via INTRAMUSCULAR

## 2020-10-05 NOTE — Patient Instructions (Signed)
You had the steroid shot today  Please take all new medication as prescribed - the antibiotic, and mucinex, and prednisone  Please continue all other medications as before, and refills have been done if requested.  Please have the pharmacy call with any other refills you may need.  Please keep your appointments with your specialists as you may have planned

## 2020-10-10 ENCOUNTER — Encounter: Payer: Self-pay | Admitting: Internal Medicine

## 2020-10-10 NOTE — Progress Notes (Signed)
Chief Complaint: follow up left ear pain, allergies, eustahcian tube issue       HPI:  Dale Campbell is a 60 y.o. male here with c/o left pain,  Here with 2-3 days pressure, headache, general weakness and malaise, with mild ST and cough, but pt denies chest pain, wheezing, increased sob or doe, orthopnea, PND, increased LE swelling, palpitations, dizziness or syncope.  Does have several wks ongoing nasal allergy symptoms with clearish congestion, itch and sneezing, without fever, pain, ST, cough, swelling or wheezing.  Does have some left ear popping and crackling as well with mild reduced hearing.    Wt Readings from Last 3 Encounters:  10/05/20 202 lb (91.6 kg)  04/24/20 195 lb (88.5 kg)  04/03/20 195 lb (88.5 kg)   BP Readings from Last 3 Encounters:  10/05/20 (!) 162/90  04/24/20 122/74  02/05/20 118/70         Past Medical History:  Diagnosis Date   Allergic rhinitis    Allergy    CELLULITIS, ARM 04/30/2009   Qualifier: Diagnosis of  By: Doralee Albino     Eczema    ED (erectile dysfunction)    Elevated glucose    Fatty liver    GERD (gastroesophageal reflux disease)    HTN (hypertension)    Hyperlipidemia    Internal hemorrhoids with bleeding, Grade 2 prolapse 05/06/2013       Personal history of colonic adenomas 04/01/2013   Pneumothorax    Snoring    ? poss OSA   Past Surgical History:  Procedure Laterality Date   COLONOSCOPY  2015   WISDOM TOOTH EXTRACTION  1990    reports that he quit smoking about 14 years ago. He has never used smokeless tobacco. He reports that he does not drink alcohol and does not use drugs. family history includes Breast cancer in his mother and sister; CAD in his father; Diabetes in his father; Heart disease in his father. Allergies  Allergen Reactions   Doxycycline     REACTION: rash   Paxil [Paroxetine]     Arthralgia, no sex drive   Current Outpatient Medications on File Prior to Visit  Medication Sig Dispense Refill    Ascorbic Acid (VITAMIN C PO) Take by mouth.     chlorpheniramine-HYDROcodone (TUSSIONEX PENNKINETIC ER) 10-8 MG/5ML SUER Take 5 mLs by mouth every 12 (twelve) hours as needed for cough. 115 mL 0   Cholecalciferol 1000 UNITS capsule Take 1,000 Units by mouth daily.     clotrimazole-betamethasone (LOTRISONE) cream APPLY TO THE AFFECTED AREA TWICE DAILY . USE FOR 2 WEEKS THEN SWITCH TO KETOCONAZOLE CREAM 90 g 0   Cyanocobalamin (VITAMIN B12 PO) Take by mouth.     diclofenac sodium (VOLTAREN) 1 % GEL Apply 1 application topically 4 (four) times daily. 100 g 3   nitroGLYCERIN (NITRO-DUR) 0.1 mg/hr patch 1/4 patch daily 30 patch 12   Omega-3 Fatty Acids (FISH OIL PO) Take by mouth.     omeprazole (PRILOSEC) 20 MG capsule Take 1 capsule (20 mg total) by mouth daily. 90 capsule 3   sildenafil (VIAGRA) 100 MG tablet Take 1 tablet (100 mg total) by mouth daily as needed for erectile dysfunction. 12 tablet 11   triamcinolone ointment (KENALOG) 0.5 % Apply 1 application topically 2 (two) times daily. 30 g 2   valsartan (DIOVAN) 160 MG tablet TAKE 1 TABLET(160 MG) BY MOUTH DAILY 90 tablet 3   zolpidem (AMBIEN) 10 MG tablet TAKE 1  TABLET(10 MG) BY MOUTH AT BEDTIME AS NEEDED FOR SLEEP 30 tablet 5   aspirin (ASPIRIN CHILDRENS) 81 MG chewable tablet Chew 1 tablet (81 mg total) by mouth daily. (Patient not taking: No sig reported) 100 tablet 3   No current facility-administered medications on file prior to visit.        ROS:  All others reviewed and negative.  Objective        PE:  BP (!) 162/90   Pulse 74   Temp 98.2 F (36.8 C) (Oral)   Resp (!) 96   Ht '5\' 11"'$  (1.803 m)   Wt 202 lb (91.6 kg)   BMI 28.17 kg/m                 Constitutional: Pt appears in NAD               HENT: Head: NCAT.                Right Ear: External ear normal.                 Left Ear: External ear normal.                Eyes: . Pupils are equal, round, and reactive to light. Conjunctivae and EOM are normal                Nose: without d/c or deformity; Bilat tm's with mild erythema.  Max sinus areas mild tender.  Pharynx with mild erythema, no exudate               Neck: Neck supple. Gross normal ROM               Cardiovascular: Normal rate and regular rhythm.                 Pulmonary/Chest: Effort normal and breath sounds without rales or wheezing.                Abd:  Soft, NT, ND, + BS, no organomegaly               Neurological: Pt is alert. At baseline orientation, motor grossly intact               Skin: Skin is warm. No rashes, no other new lesions, LE edema - none               Psychiatric: Pt behavior is normal without agitation   Micro: none  Cardiac tracings I have personally interpreted today:  none  Pertinent Radiological findings (summarize): none   Lab Results  Component Value Date   WBC 3.9 (L) 02/11/2020   HGB 15.5 02/11/2020   HCT 45.2 02/11/2020   PLT 158.0 02/11/2020   GLUCOSE 101 (H) 02/11/2020   CHOL 181 02/11/2020   TRIG 80.0 02/11/2020   HDL 34.30 (L) 02/11/2020   LDLDIRECT 135.0 08/05/2014   LDLCALC 130 (H) 02/11/2020   ALT 17 02/11/2020   AST 17 02/11/2020   NA 140 02/11/2020   K 4.0 02/11/2020   CL 104 02/11/2020   CREATININE 1.09 02/11/2020   BUN 15 02/11/2020   CO2 28 02/11/2020   TSH 1.30 02/11/2020   PSA 0.20 02/11/2020   HGBA1C 5.1 05/12/2009   Assessment/Plan:  Dale Campbell is a 60 y.o. White or Caucasian [1] male with  has a past medical history of Allergic rhinitis, Allergy, CELLULITIS, ARM (04/30/2009), Eczema, ED (erectile dysfunction), Elevated glucose, Fatty liver,  GERD (gastroesophageal reflux disease), HTN (hypertension), Hyperlipidemia, Internal hemorrhoids with bleeding, Grade 2 prolapse (05/06/2013), Personal history of colonic adenomas (04/01/2013), Pneumothorax, and Snoring.  Otitis media Mild to mod, for antibx course,  to f/u any worsening symptoms or concerns  Disorder of Eustachian tube, left Ok for mucinex otc prn,  to f/u any  worsening symptoms or concerns  Allergic rhinitis Mild to mod, for depomedrol im 80, predpac asd,,  to f/u any worsening symptoms or concerns  Followup: Return if symptoms worsen or fail to improve.  Cathlean Cower, MD 10/10/2020 10:41 PM [facility]

## 2020-10-10 NOTE — Assessment & Plan Note (Signed)
Helen for mucinex otc prn,  to f/u any worsening symptoms or concerns

## 2020-10-10 NOTE — Assessment & Plan Note (Signed)
Mild to mod, for depomedrol im 80, predpac asd,,  to f/u any worsening symptoms or concerns

## 2020-10-10 NOTE — Assessment & Plan Note (Signed)
Mild to mod, for antibx course,  to f/u any worsening symptoms or concerns 

## 2020-12-10 ENCOUNTER — Other Ambulatory Visit: Payer: Self-pay | Admitting: Internal Medicine

## 2021-02-11 ENCOUNTER — Encounter: Payer: Self-pay | Admitting: Internal Medicine

## 2021-02-11 ENCOUNTER — Ambulatory Visit (INDEPENDENT_AMBULATORY_CARE_PROVIDER_SITE_OTHER): Payer: BC Managed Care – PPO | Admitting: Internal Medicine

## 2021-02-11 ENCOUNTER — Ambulatory Visit (INDEPENDENT_AMBULATORY_CARE_PROVIDER_SITE_OTHER): Payer: BC Managed Care – PPO

## 2021-02-11 ENCOUNTER — Other Ambulatory Visit: Payer: Self-pay

## 2021-02-11 VITALS — BP 122/70 | HR 83 | Temp 98.8°F | Ht 71.0 in | Wt 193.6 lb

## 2021-02-11 DIAGNOSIS — Z0000 Encounter for general adult medical examination without abnormal findings: Secondary | ICD-10-CM

## 2021-02-11 DIAGNOSIS — R944 Abnormal results of kidney function studies: Secondary | ICD-10-CM

## 2021-02-11 DIAGNOSIS — Z23 Encounter for immunization: Secondary | ICD-10-CM

## 2021-02-11 DIAGNOSIS — F419 Anxiety disorder, unspecified: Secondary | ICD-10-CM

## 2021-02-11 DIAGNOSIS — M5136 Other intervertebral disc degeneration, lumbar region: Secondary | ICD-10-CM | POA: Diagnosis not present

## 2021-02-11 DIAGNOSIS — M5430 Sciatica, unspecified side: Secondary | ICD-10-CM

## 2021-02-11 DIAGNOSIS — R748 Abnormal levels of other serum enzymes: Secondary | ICD-10-CM | POA: Diagnosis not present

## 2021-02-11 LAB — CBC WITH DIFFERENTIAL/PLATELET
Basophils Absolute: 0 10*3/uL (ref 0.0–0.1)
Basophils Relative: 0.7 % (ref 0.0–3.0)
Eosinophils Absolute: 0.1 10*3/uL (ref 0.0–0.7)
Eosinophils Relative: 1.4 % (ref 0.0–5.0)
HCT: 48.2 % (ref 39.0–52.0)
Hemoglobin: 16.7 g/dL (ref 13.0–17.0)
Lymphocytes Relative: 25.6 % (ref 12.0–46.0)
Lymphs Abs: 1.8 10*3/uL (ref 0.7–4.0)
MCHC: 34.6 g/dL (ref 30.0–36.0)
MCV: 96.5 fl (ref 78.0–100.0)
Monocytes Absolute: 0.7 10*3/uL (ref 0.1–1.0)
Monocytes Relative: 9.9 % (ref 3.0–12.0)
Neutro Abs: 4.3 10*3/uL (ref 1.4–7.7)
Neutrophils Relative %: 62.4 % (ref 43.0–77.0)
Platelets: 209 10*3/uL (ref 150.0–400.0)
RBC: 4.99 Mil/uL (ref 4.22–5.81)
RDW: 13.1 % (ref 11.5–15.5)
WBC: 6.9 10*3/uL (ref 4.0–10.5)

## 2021-02-11 LAB — COMPREHENSIVE METABOLIC PANEL
ALT: 50 U/L (ref 0–53)
AST: 43 U/L — ABNORMAL HIGH (ref 0–37)
Albumin: 4.7 g/dL (ref 3.5–5.2)
Alkaline Phosphatase: 79 U/L (ref 39–117)
BUN: 10 mg/dL (ref 6–23)
CO2: 26 mEq/L (ref 19–32)
Calcium: 10 mg/dL (ref 8.4–10.5)
Chloride: 101 mEq/L (ref 96–112)
Creatinine, Ser: 1.35 mg/dL (ref 0.40–1.50)
GFR: 57.26 mL/min — ABNORMAL LOW (ref >60.00)
Glucose, Bld: 96 mg/dL (ref 70–99)
Potassium: 3.7 mEq/L (ref 3.5–5.1)
Sodium: 138 mEq/L (ref 135–145)
Total Bilirubin: 1.2 mg/dL (ref 0.2–1.2)
Total Protein: 7.9 g/dL (ref 6.0–8.3)

## 2021-02-11 LAB — URINALYSIS, ROUTINE W REFLEX MICROSCOPIC
Hgb urine dipstick: NEGATIVE
Ketones, ur: NEGATIVE
Leukocytes,Ua: NEGATIVE
Nitrite: NEGATIVE
RBC / HPF: NONE SEEN (ref 0–?)
Specific Gravity, Urine: 1.025 (ref 1.000–1.030)
Total Protein, Urine: 30 — AB
Urine Glucose: NEGATIVE
Urobilinogen, UA: 1 (ref 0.0–1.0)
pH: 6 (ref 5.0–8.0)

## 2021-02-11 LAB — LIPID PANEL
Cholesterol: 201 mg/dL — ABNORMAL HIGH (ref 0–200)
HDL: 31.8 mg/dL — ABNORMAL LOW (ref >39.00)
NonHDL: 168.77
Total CHOL/HDL Ratio: 6
Triglycerides: 212 mg/dL — ABNORMAL HIGH (ref 0.0–149.0)
VLDL: 42.4 mg/dL — ABNORMAL HIGH (ref 0.0–40.0)

## 2021-02-11 LAB — TSH: TSH: 2.01 u[IU]/mL (ref 0.35–5.50)

## 2021-02-11 LAB — LDL CHOLESTEROL, DIRECT: Direct LDL: 159 mg/dL

## 2021-02-11 LAB — PSA: PSA: 0.27 ng/mL (ref 0.10–4.00)

## 2021-02-11 MED ORDER — METHYLPREDNISOLONE 4 MG PO TBPK: ORAL_TABLET | ORAL | 0 refills | Status: DC

## 2021-02-11 MED ORDER — CLONAZEPAM 0.5 MG PO TABS: 0.5000 mg | ORAL_TABLET | Freq: Two times a day (BID) | ORAL | 1 refills | Status: DC | PRN

## 2021-02-11 MED ORDER — HYDROCODONE-ACETAMINOPHEN 5-325 MG PO TABS: 1.0000 | ORAL_TABLET | Freq: Four times a day (QID) | ORAL | 0 refills | Status: DC | PRN

## 2021-02-11 MED ORDER — METHYLPREDNISOLONE ACETATE 80 MG/ML IJ SUSP
80.0000 mg | Freq: Once | INTRAMUSCULAR | Status: AC
  Administered 2021-02-11: 80 mg via INTRAMUSCULAR

## 2021-02-11 NOTE — Patient Instructions (Signed)

## 2021-02-11 NOTE — Assessment & Plan Note (Addendum)
We discussed age appropriate health related issues, including available/recomended screening tests and vaccinations. We discussed a need for adhering to healthy diet and exercise. Labs/EKG were reviewed/ordered. All questions were answered. Cardiac CT calcium scoring offered  Shingrix 2019 Colon 1/15 due in 2020; 2022

## 2021-02-11 NOTE — Assessment & Plan Note (Signed)
Clonazepam prn stress

## 2021-02-11 NOTE — Assessment & Plan Note (Addendum)
New - R leg X ray Medrol pack Norco prn

## 2021-02-11 NOTE — Progress Notes (Signed)
Subjective:  Patient ID: Dale Campbell, male    DOB: [birth year]  Age: 60 y.o. MRN: [id number]  CC: Annual Exam   HPI Dale Campbell presents for a well exam C/o a dull moderate R LBP irrad down to the R ankle 9/10 at times; worse w/twisting  x 2 weeks; did some yard work Not better w/standing, laying down Taking Ibuprofen 2 bid - no help   Outpatient Medications Prior to Visit  Medication Sig Dispense Refill   Ascorbic Acid (VITAMIN C PO) Take by mouth.     Cholecalciferol 1000 UNITS capsule Take 1,000 Units by mouth daily.     clotrimazole-betamethasone (LOTRISONE) cream APPLY TO THE AFFECTED AREA TWICE DAILY . USE FOR 2 WEEKS THEN SWITCH TO KETOCONAZOLE CREAM 90 g 0   Cyanocobalamin (VITAMIN B12 PO) Take by mouth.     diclofenac sodium (VOLTAREN) 1 % GEL Apply 1 application topically 4 (four) times daily. 100 g 3   guaiFENesin (MUCINEX) 600 MG 12 hr tablet Take 2 tablets (1,200 mg total) by mouth 2 (two) times daily as needed. 60 tablet 1   nitroGLYCERIN (NITRO-DUR) 0.1 mg/hr patch 1/4 patch daily 30 patch 12   Omega-3 Fatty Acids (FISH OIL PO) Take by mouth.     omeprazole (PRILOSEC) 20 MG capsule Take 1 capsule (20 mg total) by mouth daily. 90 capsule 3   sildenafil (VIAGRA) 100 MG tablet TAKE ONE TABLET BY MOUTH ONE TIME DAILY AS NEEDED FOR ED. MAX 100MG  (1 TABLET) PER 24 HOURS 10 tablet 0   triamcinolone ointment (KENALOG) 0.5 % Apply 1 application topically 2 (two) times daily. 30 g 2   valsartan (DIOVAN) 160 MG tablet TAKE 1 TABLET(160 MG) BY MOUTH DAILY 90 tablet 3   zolpidem (AMBIEN) 10 MG tablet TAKE 1 TABLET(10 MG) BY MOUTH AT BEDTIME AS NEEDED FOR SLEEP 30 tablet 5   aspirin (ASPIRIN CHILDRENS) 81 MG chewable tablet Chew 1 tablet (81 mg total) by mouth daily. (Patient not taking: Reported on 04/24/2020) 100 tablet 3   chlorpheniramine-HYDROcodone (TUSSIONEX PENNKINETIC ER) 10-8 MG/5ML SUER Take 5 mLs by mouth every 12 (twelve) hours as needed for cough. (Patient not  taking: Reported on 02/11/2021) 115 mL 0   predniSONE (DELTASONE) 10 MG tablet 3 tabs by mouth per day for 3 days,2tabs per day for 3 days,1tab per day for 3 days (Patient not taking: Reported on 02/11/2021) 18 tablet 0   No facility-administered medications prior to visit.    ROS: Review of Systems  Constitutional:  Positive for fatigue. Negative for appetite change and unexpected weight change.  HENT:  Negative for congestion, nosebleeds, sneezing, sore throat and trouble swallowing.   Eyes:  Negative for itching and visual disturbance.  Respiratory:  Negative for cough.   Cardiovascular:  Negative for chest pain, palpitations and leg swelling.  Gastrointestinal:  Negative for abdominal distention, blood in stool, diarrhea and nausea.  Genitourinary:  Negative for frequency and hematuria.  Musculoskeletal:  Positive for back pain and gait problem. Negative for joint swelling, neck pain and neck stiffness.  Skin:  Negative for rash.  Neurological:  Negative for dizziness, tremors, speech difficulty and weakness.  Psychiatric/Behavioral:  Negative for agitation, dysphoric mood and sleep disturbance. The patient is not nervous/anxious.    Objective:  BP 122/70 (BP Location: Left Arm)   Pulse 83   Temp 98.8 F (37.1 C) (Oral)   Ht 5\' 11"  (1.803 m)   Wt 193 lb 9.6 oz (87.8 kg)  SpO2 95%   BMI 27.00 kg/m   BP Readings from Last 3 Encounters:  02/11/21 122/70  10/05/20 (!) 162/90  04/24/20 122/74    Wt Readings from Last 3 Encounters:  02/11/21 193 lb 9.6 oz (87.8 kg)  10/05/20 202 lb (91.6 kg)  04/24/20 195 lb (88.5 kg)    Physical Exam Constitutional:      General: He is not in acute distress.    Appearance: He is well-developed.     Comments: NAD  Eyes:     Conjunctiva/sclera: Conjunctivae normal.     Pupils: Pupils are equal, round, and reactive to light.  Neck:     Thyroid: No thyromegaly.     Vascular: No JVD.  Cardiovascular:     Rate and Rhythm: Normal rate  and regular rhythm.     Heart sounds: Normal heart sounds. No murmur heard.   No friction rub. No gallop.  Pulmonary:     Effort: Pulmonary effort is normal. No respiratory distress.     Breath sounds: Normal breath sounds. No wheezing or rales.  Chest:     Chest wall: No tenderness.  Abdominal:     General: Bowel sounds are normal. There is no distension.     Palpations: Abdomen is soft. There is no mass.     Tenderness: There is no abdominal tenderness. There is no guarding or rebound.  Musculoskeletal:        General: No tenderness. Normal range of motion.     Cervical back: Normal range of motion.  Lymphadenopathy:     Cervical: No cervical adenopathy.  Skin:    General: Skin is warm and dry.     Findings: No rash.  Neurological:     Mental Status: He is alert and oriented to person, place, and time.     Cranial Nerves: No cranial nerve deficit.     Motor: No abnormal muscle tone.     Coordination: Coordination normal.     Gait: Gait normal.     Deep Tendon Reflexes: Reflexes are normal and symmetric.  Psychiatric:        Behavior: Behavior normal.        Thought Content: Thought content normal.        Judgment: Judgment normal.   Rectal - per GI  I spent 22 minutes in addition to time for CPX wellness examination in preparing to see the patient by review of recent labs, imaging and procedures, obtaining and reviewing separately obtained history, communicating with the patient, ordering medications, tests or procedures, and documenting clinical information in the EHR including the differential diagnosis, treatment, and any further evaluation and other management of decreased renal function, anxiety, sciatic leg pain.        Lab Results  Component Value Date   WBC 6.9 02/11/2021   HGB 16.7 02/11/2021   HCT 48.2 02/11/2021   PLT 209.0 02/11/2021   GLUCOSE 96 02/11/2021   CHOL 201 (H) 02/11/2021   TRIG 212.0 (H) 02/11/2021   HDL 31.80 (L) 02/11/2021   LDLDIRECT 159.0  02/11/2021   LDLCALC 130 (H) 02/11/2020   ALT 50 02/11/2021   AST 43 (H) 02/11/2021   NA 138 02/11/2021   K 3.7 02/11/2021   CL 101 02/11/2021   CREATININE 1.35 02/11/2021   BUN 10 02/11/2021   CO2 26 02/11/2021   TSH 2.01 02/11/2021   PSA 0.27 02/11/2021   HGBA1C 5.1 05/12/2009    DG Shoulder Right  Result Date: 12/31/2018 CLINICAL DATA:  Pain, no known injury EXAM: RIGHT SHOULDER - 2+ VIEW COMPARISON:  None. FINDINGS: No fracture or dislocation of the right shoulder. There is mild acromioclavicular and glenohumeral arthrosis. The partially imaged right chest is unremarkable. IMPRESSION: No fracture or dislocation of the right shoulder. There is mild acromioclavicular and glenohumeral arthrosis. Electronically Signed   By: Eddie Candle M.D.   On: 12/31/2018 15:35   [place] LIMITED JOINT SPACE STRUCTURES UP RIGHT  Result Date: 01/04/2019 [facility] US performed of: Right This study was ordered, performed, and interpreted by Charlann Boxer D.O. Shoulder: Supraspinatus: A patient is alert appears to be a fairly large tear and does have what appears to be some type of cystic formation within the glenohumeral joint that extends anteriorly and medially. Subscapularis: Significant hypoechoic changes noted. AC joint: Capsule distention., geyser sign. Glenohumeral Joint: Appears normal without effusion. Glenoid Labrum: Intact without visualized tears. Biceps Tendon: Hypoechoic changes. Impression: Subacromial bursitis, rotator cuff tear with cystic formation Procedure: Real-time Ultrasound Guided Injection of right glenohumeral joint Device: GE Logiq E Ultrasound guided injection is preferred based studies that show increased duration, increased effect, greater accuracy, decreased procedural pain, increased response rate with ultrasound guided versus blind injection. Verbal informed consent obtained. Time-out conducted. Noted no overlying erythema, induration, or other signs of local infection. Skin prepped in a  sterile fashion. Local anesthesia: Topical Ethyl chloride. With sterile technique and under real time ultrasound guidance: Joint visualized. 23g 1  inch needle inserted posterior approach. Pictures taken for needle placement. Patient did have injection of 2 cc of 1% lidocaine, 2 cc of 0.5% Marcaine, and 1.0 cc of Kenalog 40 mg/dL. Completed without difficulty Pain immediately resolved suggesting accurate placement of the medication. Advised to call if fevers/chills, erythema, induration, drainage, or persistent bleeding. Images permanently stored and available for review in the ultrasound unit. Impression: Technically successful ultrasound guided injection.    Assessment & Plan:   Problem List Items Addressed This Visit     Anxiety    Clonazepam prn stress         Decreased GFR    New.  Improve hydration.  Obtain renal ultrasound      Elevated liver enzymes     New mildly elevated AST.  Obtain liver ultrasound      Sciatic leg pain    New - R leg X ray Medrol pack Norco prn       Relevant Medications   clonazePAM (KLONOPIN) 0.5 MG tablet   Other Relevant Orders   DG Lumbar Spine 2-3 Views   Well adult exam    We discussed age appropriate health related issues, including available/recomended screening tests and vaccinations. We discussed a need for adhering to healthy diet and exercise. Labs/EKG were reviewed/ordered. All questions were answered. Cardiac CT calcium scoring offered  Shingrix 2019 Colon 1/15 due in 2020; 2022       Relevant Orders   TSH (Completed)   Urinalysis   CBC with Differential/Platelet (Completed)   Lipid panel (Completed)   Comprehensive metabolic panel (Completed)   PSA (Completed)   Other Visit Diagnoses     Needs flu shot             Meds ordered this encounter  Medications   methylPREDNISolone (MEDROL DOSEPAK) 4 MG TBPK tablet    Sig: As directed    Dispense:  21 tablet    Refill:  0   HYDROcodone-acetaminophen  (NORCO/VICODIN) 5-325 MG tablet    Sig: Take 1 tablet by mouth every 6 (  six) hours as needed for severe pain.    Dispense:  20 tablet    Refill:  0   clonazePAM (KLONOPIN) 0.5 MG tablet    Sig: Take 1 tablet (0.5 mg total) by mouth 2 (two) times daily as needed for anxiety. Spasms    Dispense:  60 tablet    Refill:  1   methylPREDNISolone acetate (DEPO-MEDROL) injection 80 mg      Follow-up: Return in about 4 weeks (around 03/11/2021) for a follow-up visit.  Walker Kehr, MD

## 2021-02-14 DIAGNOSIS — R748 Abnormal levels of other serum enzymes: Secondary | ICD-10-CM | POA: Insufficient documentation

## 2021-02-14 DIAGNOSIS — R944 Abnormal results of kidney function studies: Secondary | ICD-10-CM | POA: Insufficient documentation

## 2021-02-14 NOTE — Assessment & Plan Note (Signed)
New.  Improve hydration.  Obtain renal ultrasound

## 2021-02-14 NOTE — Assessment & Plan Note (Signed)
New mildly elevated AST.  Obtain liver ultrasound

## 2021-02-21 ENCOUNTER — Other Ambulatory Visit: Payer: Self-pay | Admitting: Internal Medicine

## 2021-03-01 ENCOUNTER — Encounter: Payer: Self-pay | Admitting: Internal Medicine

## 2021-03-10 ENCOUNTER — Encounter: Payer: Self-pay | Admitting: Internal Medicine

## 2021-03-11 ENCOUNTER — Other Ambulatory Visit: Payer: Self-pay | Admitting: Internal Medicine

## 2021-03-11 ENCOUNTER — Ambulatory Visit: Payer: BC Managed Care – PPO | Admitting: Internal Medicine

## 2021-03-16 DIAGNOSIS — M9904 Segmental and somatic dysfunction of sacral region: Secondary | ICD-10-CM | POA: Diagnosis not present

## 2021-03-16 DIAGNOSIS — M9902 Segmental and somatic dysfunction of thoracic region: Secondary | ICD-10-CM | POA: Diagnosis not present

## 2021-03-16 DIAGNOSIS — M9903 Segmental and somatic dysfunction of lumbar region: Secondary | ICD-10-CM | POA: Diagnosis not present

## 2021-03-16 DIAGNOSIS — M5417 Radiculopathy, lumbosacral region: Secondary | ICD-10-CM | POA: Diagnosis not present

## 2021-03-17 DIAGNOSIS — M5417 Radiculopathy, lumbosacral region: Secondary | ICD-10-CM | POA: Diagnosis not present

## 2021-03-17 DIAGNOSIS — M9903 Segmental and somatic dysfunction of lumbar region: Secondary | ICD-10-CM | POA: Diagnosis not present

## 2021-03-17 DIAGNOSIS — M9904 Segmental and somatic dysfunction of sacral region: Secondary | ICD-10-CM | POA: Diagnosis not present

## 2021-03-17 DIAGNOSIS — M9902 Segmental and somatic dysfunction of thoracic region: Secondary | ICD-10-CM | POA: Diagnosis not present

## 2021-03-19 DIAGNOSIS — M9904 Segmental and somatic dysfunction of sacral region: Secondary | ICD-10-CM | POA: Diagnosis not present

## 2021-03-19 DIAGNOSIS — M9903 Segmental and somatic dysfunction of lumbar region: Secondary | ICD-10-CM | POA: Diagnosis not present

## 2021-03-19 DIAGNOSIS — M5417 Radiculopathy, lumbosacral region: Secondary | ICD-10-CM | POA: Diagnosis not present

## 2021-03-19 DIAGNOSIS — M9902 Segmental and somatic dysfunction of thoracic region: Secondary | ICD-10-CM | POA: Diagnosis not present

## 2021-03-22 MED ORDER — HYDROCODONE-ACETAMINOPHEN 5-325 MG PO TABS: 1.0000 | ORAL_TABLET | Freq: Two times a day (BID) | ORAL | 0 refills | Status: DC | PRN

## 2021-03-23 DIAGNOSIS — M9904 Segmental and somatic dysfunction of sacral region: Secondary | ICD-10-CM | POA: Diagnosis not present

## 2021-03-23 DIAGNOSIS — M9903 Segmental and somatic dysfunction of lumbar region: Secondary | ICD-10-CM | POA: Diagnosis not present

## 2021-03-23 DIAGNOSIS — M5417 Radiculopathy, lumbosacral region: Secondary | ICD-10-CM | POA: Diagnosis not present

## 2021-03-23 DIAGNOSIS — M9902 Segmental and somatic dysfunction of thoracic region: Secondary | ICD-10-CM | POA: Diagnosis not present

## 2021-03-25 DIAGNOSIS — M9903 Segmental and somatic dysfunction of lumbar region: Secondary | ICD-10-CM | POA: Diagnosis not present

## 2021-03-25 DIAGNOSIS — M5417 Radiculopathy, lumbosacral region: Secondary | ICD-10-CM | POA: Diagnosis not present

## 2021-03-25 DIAGNOSIS — M9904 Segmental and somatic dysfunction of sacral region: Secondary | ICD-10-CM | POA: Diagnosis not present

## 2021-03-25 DIAGNOSIS — M9902 Segmental and somatic dysfunction of thoracic region: Secondary | ICD-10-CM | POA: Diagnosis not present

## 2021-03-26 DIAGNOSIS — M9904 Segmental and somatic dysfunction of sacral region: Secondary | ICD-10-CM | POA: Diagnosis not present

## 2021-03-26 DIAGNOSIS — M9903 Segmental and somatic dysfunction of lumbar region: Secondary | ICD-10-CM | POA: Diagnosis not present

## 2021-03-26 DIAGNOSIS — M9902 Segmental and somatic dysfunction of thoracic region: Secondary | ICD-10-CM | POA: Diagnosis not present

## 2021-03-26 DIAGNOSIS — M5417 Radiculopathy, lumbosacral region: Secondary | ICD-10-CM | POA: Diagnosis not present

## 2021-03-30 DIAGNOSIS — M9903 Segmental and somatic dysfunction of lumbar region: Secondary | ICD-10-CM | POA: Diagnosis not present

## 2021-03-30 DIAGNOSIS — M5417 Radiculopathy, lumbosacral region: Secondary | ICD-10-CM | POA: Diagnosis not present

## 2021-03-30 DIAGNOSIS — M9902 Segmental and somatic dysfunction of thoracic region: Secondary | ICD-10-CM | POA: Diagnosis not present

## 2021-03-30 DIAGNOSIS — M9904 Segmental and somatic dysfunction of sacral region: Secondary | ICD-10-CM | POA: Diagnosis not present

## 2021-05-08 ENCOUNTER — Other Ambulatory Visit: Payer: Self-pay | Admitting: Internal Medicine

## 2021-05-12 MED ORDER — HYDROCODONE-ACETAMINOPHEN 5-325 MG PO TABS: 1.0000 | ORAL_TABLET | Freq: Two times a day (BID) | ORAL | 0 refills | Status: DC | PRN

## 2021-05-20 ENCOUNTER — Ambulatory Visit (HOSPITAL_COMMUNITY)
Admission: EM | Admit: 2021-05-20 | Discharge: 2021-05-20 | Disposition: A | Discharge status: Home / Self Care | Payer: BC Managed Care – PPO | Attending: Family Medicine | Admitting: Family Medicine

## 2021-05-20 ENCOUNTER — Other Ambulatory Visit: Payer: Self-pay

## 2021-05-20 DIAGNOSIS — K529 Noninfective gastroenteritis and colitis, unspecified: Secondary | ICD-10-CM | POA: Diagnosis not present

## 2021-05-20 MED ORDER — DIPHENOXYLATE-ATROPINE 2.5-0.025 MG PO TABS: 1.0000 | ORAL_TABLET | Freq: Four times a day (QID) | ORAL | 0 refills | Status: DC | PRN

## 2021-05-20 MED ORDER — DICYCLOMINE HCL 20 MG PO TABS: 20.0000 mg | ORAL_TABLET | Freq: Four times a day (QID) | ORAL | 0 refills | Status: DC | PRN

## 2021-05-20 NOTE — ED Triage Notes (Signed)
PT reports abdominal pain, diarrhea, and vomiting. Started Monday. Last episode of vomiting was Tuesday. 6 episodes of diarrhea yesterday. Has not had much of an appetite, but is drinking a lot of water.  ?

## 2021-05-20 NOTE — Discharge Instructions (Addendum)
Take diphenoxylate-atropine 2.5-0.025 mg--1 to 2 tablets 4 times daily as needed for diarrhea.  Do not exceed more than 8 tablets in 24 hours ? ?If you still have cramps after taking the above medication, you can take dicyclomine 20 mg 1 every 6 hours needed for intestinal cramps ? ?Clear liquids and bland diet ?

## 2021-05-20 NOTE — ED Provider Notes (Signed)
[facility]    CSN: [id number] Arrival date & time: 05/20/21  9024      History   Chief Complaint Chief Complaint  Patient presents with   Abdominal Pain   Diarrhea    HPI Dale Campbell is a 61 y.o. male.    Abdominal Pain Associated symptoms: diarrhea   Diarrhea Associated symptoms: abdominal pain   Here for vomiting and diarrhea that began March 6  March 6 he started having diarrhea and has had loose watery stools 7-8 times a day since then.  Today he is already had 3 or 4 stools since 430 this morning, so that is over about 6 hours.  He states he has not had any antibiotics in the last few months  He also did have some emesis 1 or 2 times a day on March 6 and on March 7.  No fever or chills.  And no upper respiratory symptoms  No blood in the stool.  No dysuria or hematuria either.  Imodium has not helped.  He is still cramping a good bit  Past Medical History:  Diagnosis Date   Allergic rhinitis    Allergy    CELLULITIS, ARM 04/30/2009   Qualifier: Diagnosis of  By: Doralee Albino     Eczema    ED (erectile dysfunction)    Elevated glucose    Fatty liver    GERD (gastroesophageal reflux disease)    HTN (hypertension)    Hyperlipidemia    Internal hemorrhoids with bleeding, Grade 2 prolapse 05/06/2013       Personal history of colonic adenomas 04/01/2013   Pneumothorax    Snoring    ? poss OSA    Patient Active Problem List   Diagnosis Date Noted   Elevated liver enzymes 02/14/2021   Decreased GFR 02/14/2021   Sciatic leg pain 02/11/2021   Disorder of Eustachian tube, left 10/05/2020   Anxiety 08/15/2019   Right rotator cuff tear 12/31/2018   Colon polyps 11/26/2018   Shoulder pain 10/03/2018   Conjunctivitis 03/16/2018   Elevated LFTs 08/22/2015   Ridged nails 08/21/2015   Weight gain 08/12/2014   GERD (gastroesophageal reflux disease) 09/25/2013   Internal hemorrhoids with bleeding, Grade 2 prolapse 05/06/2013   Personal history of  colonic adenomas 04/01/2013   Well adult exam 02/26/2013   Dyslipidemia 02/26/2013   Insomnia 05/07/2012   Erectile dysfunction 01/16/2012   TMJ (temporomandibular joint disorder) 10/18/2011   Otitis media 06/22/2011   Neck pain 04/22/2011   Acute sinusitis 03/09/2011   OLECRANON BURSITIS, LEFT 04/30/2009   Essential hypertension 02/12/2009   DYSPNEA 02/12/2009   SNORING 02/12/2009   TOBACCO USE, QUIT 02/12/2009   Diarrhea 10/08/2008   Allergic rhinitis 07/13/2007   PRURITUS 07/13/2007   Rash and other nonspecific skin eruption 07/13/2007    Past Surgical History:  Procedure Laterality Date   COLONOSCOPY  2015   WISDOM TOOTH EXTRACTION  1990       Home Medications    Prior to Admission medications   Medication Sig Start Date End Date Taking? Authorizing Provider  Ascorbic Acid (VITAMIN C PO) Take by mouth.   Yes [provider]  clonazePAM (KLONOPIN) 0.5 MG tablet Take 1 tablet (0.5 mg total) by mouth 2 (two) times daily as needed for anxiety. Spasms 02/11/21  Yes Plotnikov, Evie Lacks, MD  Cyanocobalamin (VITAMIN B12 PO) Take by mouth.   Yes [provider]  dicyclomine (BENTYL) 20 MG tablet Take 1 tablet (20 mg total) by  mouth every 6 (six) hours as needed for spasms. 05/20/21  Yes Barrett Henle, MD  diphenoxylate-atropine (LOMOTIL) 2.5-0.025 MG tablet Take 1-2 tablets by mouth 4 (four) times daily as needed for diarrhea or loose stools. 05/20/21  Yes Makinna Andy, Gwenlyn Perking, MD  HYDROcodone-acetaminophen (NORCO/VICODIN) 5-325 MG tablet Take 1 tablet by mouth 2 (two) times daily as needed for severe pain. 05/12/21 05/12/22 Yes Plotnikov, Evie Lacks, MD  omeprazole (PRILOSEC) 20 MG capsule Take 1 capsule (20 mg total) by mouth daily. 11/26/18  Yes Plotnikov, Evie Lacks, MD  valsartan (DIOVAN) 160 MG tablet TAKE 1 TABLET(160 MG) BY MOUTH DAILY 08/24/20  Yes Plotnikov, Evie Lacks, MD  zolpidem (AMBIEN) 10 MG tablet TAKE 1 TABLET(10 MG) BY MOUTH AT BEDTIME AS NEEDED FOR  SLEEP 02/22/21  Yes Plotnikov, Evie Lacks, MD  Cholecalciferol 1000 UNITS capsule Take 1,000 Units by mouth daily.    [provider]  clotrimazole-betamethasone (LOTRISONE) cream APPLY TO THE AFFECTED AREA TWICE DAILY . USE FOR 2 WEEKS THEN SWITCH TO KETOCONAZOLE CREAM 04/15/18   Plotnikov, Evie Lacks, MD  diclofenac sodium (VOLTAREN) 1 % GEL Apply 1 application topically 4 (four) times daily. 11/26/18   Plotnikov, Evie Lacks, MD  guaiFENesin (MUCINEX) 600 MG 12 hr tablet Take 2 tablets (1,200 mg total) by mouth 2 (two) times daily as needed. 10/05/20   Biagio Borg, MD  nitroGLYCERIN (NITRO-DUR) 0.1 mg/hr patch 1/4 patch daily 12/31/18   Lyndal Pulley, DO  Omega-3 Fatty Acids (FISH OIL PO) Take by mouth.    [provider]  sildenafil (VIAGRA) 100 MG tablet TAKE ONE TABLET BY MOUTH ONE TIME DAILY AS NEEDED FOR ED. MAX '100MG'$  (1 TABLET) PER 24 HOURS 12/10/20   Plotnikov, Evie Lacks, MD  triamcinolone ointment (KENALOG) 0.5 % Apply 1 application topically 2 (two) times daily. 08/21/15   Plotnikov, Evie Lacks, MD    Family History Family History  Problem Relation Age of Onset   CAD Father    Heart disease Father    Diabetes Father        Type 2   Breast cancer Mother    Breast cancer Sister    Psoriasis Neg Hx    Colon cancer Neg Hx    Esophageal cancer Neg Hx    Stomach cancer Neg Hx    Colon polyps Neg Hx    Rectal cancer Neg Hx     Social History Social History   Tobacco Use   Smoking status: Former    Types: Cigarettes    Quit date: 03/14/2006    Years since quitting: 15.1   Smokeless tobacco: Never  Vaping Use   Vaping Use: Never used  Substance Use Topics   Alcohol use: No    Comment: occasionally   Drug use: No     Allergies   Doxycycline and Paxil [paroxetine]   Review of Systems Review of Systems  Gastrointestinal:  Positive for abdominal pain and diarrhea.    Physical Exam Triage Vital Signs ED Triage Vitals  Enc Vitals Group     BP 05/20/21  1012 (!) 171/98     Pulse Rate 05/20/21 1012 76     Resp 05/20/21 1012 16     Temp 05/20/21 1012 98.5 F (36.9 C)     Temp Source 05/20/21 1012 Oral     SpO2 05/20/21 1012 98 %     Weight --      Height --      Head Circumference --  Peak Flow --      Pain Score 05/20/21 1009 4     Pain Loc --      Pain Edu? --      Excl. in [place]? --    No data found.  Updated Vital Signs BP (!) 171/98    Pulse 76    Temp 98.5 F (36.9 C) (Oral)    Resp 16    SpO2 98%   Visual Acuity Right Eye Distance:   Left Eye Distance:   Bilateral Distance:    Right Eye Near:   Left Eye Near:    Bilateral Near:     Physical Exam Vitals reviewed.  Constitutional:      General: He is not in acute distress.    Appearance: He is not toxic-appearing.  HENT:     Mouth/Throat:     Mouth: Mucous membranes are moist.     Comments: There is some saliva in the mouth  Eyes:     Extraocular Movements: Extraocular movements intact.     Pupils: Pupils are equal, round, and reactive to light.  Cardiovascular:     Rate and Rhythm: Normal rate and regular rhythm.     Heart sounds: No murmur heard. Pulmonary:     Effort: Pulmonary effort is normal.     Breath sounds: Normal breath sounds.  Abdominal:     General: Bowel sounds are normal. There is no distension.     Palpations: Abdomen is soft. There is no mass.     Tenderness: There is no abdominal tenderness. There is no guarding.  Musculoskeletal:     Cervical back: Neck supple.  Lymphadenopathy:     Cervical: No cervical adenopathy.  Skin:    Capillary Refill: Capillary refill takes less than 2 seconds.     Coloration: Skin is not jaundiced or pale.  Neurological:     General: No focal deficit present.     Mental Status: He is alert and oriented to person, place, and time.  Psychiatric:        Behavior: Behavior normal.     UC Treatments / Results  Labs (all labs ordered are listed, but only abnormal results are displayed) Labs Reviewed  - No data to display  EKG   Radiology No results found.  Procedures Procedures (including critical care time)  Medications Ordered in UC Medications - No data to display  Initial Impression / Assessment and Plan / UC Course  I have reviewed the triage vital signs and the nursing notes.  Pertinent labs & imaging results that were available during my care of the patient were reviewed by me and considered in my medical decision making (see chart for details).     We will treat his symptoms with Lomotil, and dicyclomine if he is still cramping after Lomotil.  Discussed with him if he continues to have such frequent stools and he is feeling more and more dizzy, he should go to the emergency room.  If the diarrhea continues though he is able to hydrate orally, he is to let his primary office know this is going on, for possible testing Final Clinical Impressions(s) / UC Diagnoses   Final diagnoses:  Gastroenteritis     Discharge Instructions      Take diphenoxylate-atropine 2.5-0.025 mg--1 to 2 tablets 4 times daily as needed for diarrhea.  Do not exceed more than 8 tablets in 24 hours  If you still have cramps after taking the above medication, you can take dicyclomine 20  mg 1 every 6 hours needed for intestinal cramps  Clear liquids and bland diet     ED Prescriptions     Medication Sig Dispense Auth. Provider   dicyclomine (BENTYL) 20 MG tablet Take 1 tablet (20 mg total) by mouth every 6 (six) hours as needed for spasms. 20 tablet Barrett Henle, MD   diphenoxylate-atropine (LOMOTIL) 2.5-0.025 MG tablet Take 1-2 tablets by mouth 4 (four) times daily as needed for diarrhea or loose stools. 16 tablet Caprisha Bridgett, Gwenlyn Perking, MD      I have reviewed the PDMP during this encounter.   Barrett Henle, MD 05/20/21 1045

## 2021-06-03 ENCOUNTER — Other Ambulatory Visit: Payer: Self-pay | Admitting: Internal Medicine

## 2021-06-03 NOTE — Telephone Encounter (Signed)
LOV: 02/11/2021 ?

## 2021-07-05 ENCOUNTER — Other Ambulatory Visit: Payer: Self-pay | Admitting: Internal Medicine

## 2021-07-05 NOTE — Telephone Encounter (Signed)
Check [place] registry last filled 06/03/2021.../lmb ?

## 2021-08-04 ENCOUNTER — Other Ambulatory Visit: Payer: Self-pay | Admitting: Internal Medicine

## 2021-08-06 ENCOUNTER — Telehealth: Payer: Self-pay

## 2021-08-06 NOTE — Telephone Encounter (Signed)
Pt is requesting a refill on: zolpidem (AMBIEN) 10 MG tablet  Pharmacy: [id number] - [place], [address] AT [address]  LOV 02/11/21 ROV 08/23/21  Requesting short supply.

## 2021-08-09 MED ORDER — ZOLPIDEM TARTRATE 10 MG PO TABS: ORAL_TABLET | ORAL | 0 refills | Status: DC

## 2021-08-09 NOTE — Telephone Encounter (Signed)
Okay.  Thanks.

## 2021-08-23 ENCOUNTER — Ambulatory Visit: Payer: BC Managed Care – PPO | Admitting: Internal Medicine

## 2021-08-23 ENCOUNTER — Encounter: Payer: Self-pay | Admitting: Internal Medicine

## 2021-08-23 DIAGNOSIS — G4709 Other insomnia: Secondary | ICD-10-CM | POA: Diagnosis not present

## 2021-08-23 DIAGNOSIS — F419 Anxiety disorder, unspecified: Secondary | ICD-10-CM | POA: Diagnosis not present

## 2021-08-23 DIAGNOSIS — I10 Essential (primary) hypertension: Secondary | ICD-10-CM | POA: Diagnosis not present

## 2021-08-23 MED ORDER — AMLODIPINE BESYLATE 5 MG PO TABS: 5.0000 mg | ORAL_TABLET | Freq: Every day | ORAL | 3 refills | Status: DC

## 2021-08-23 MED ORDER — TELMISARTAN 80 MG PO TABS: 80.0000 mg | ORAL_TABLET | Freq: Every day | ORAL | 3 refills | Status: DC

## 2021-08-23 MED ORDER — CLONAZEPAM 0.5 MG PO TABS: ORAL_TABLET | ORAL | 1 refills | Status: DC

## 2021-08-23 MED ORDER — HYDROCODONE-ACETAMINOPHEN 5-325 MG PO TABS
1.0000 | ORAL_TABLET | Freq: Two times a day (BID) | ORAL | 0 refills | Status: DC | PRN
Start: 2021-08-23 — End: 2021-10-07

## 2021-08-23 NOTE — Assessment & Plan Note (Signed)
Start Micardis and Norvasc

## 2021-08-23 NOTE — Assessment & Plan Note (Addendum)
Chronic Zolpidem  Potential benefits of a long term benzodiazepines  use as well as potential risks  and complications were explained to the patient and were aknowledged. 

## 2021-08-23 NOTE — Assessment & Plan Note (Addendum)
  Clonazepam prn rare. Not to take w/Zolpidem  Potential benefits of a long term benzodiazepines  use as well as potential risks  and complications were explained to the patient and were aknowledged. 

## 2021-08-23 NOTE — Progress Notes (Signed)
Subjective:  Patient ID: Dale Campbell, male    DOB: 1960/06/29  Age: 61 y.o. MRN: [id number]  CC: No chief complaint on file.   HPI SOHUM DELILLO presents for HTN - worse. He ran out of BP meds 1 week ago. F/u insomnia, GERD   Outpatient Medications Prior to Visit  Medication Sig Dispense Refill   Ascorbic Acid (VITAMIN C PO) Take by mouth.     Cholecalciferol 1000 UNITS capsule Take 1,000 Units by mouth daily.     clotrimazole-betamethasone (LOTRISONE) cream APPLY TO THE AFFECTED AREA TWICE DAILY . USE FOR 2 WEEKS THEN SWITCH TO KETOCONAZOLE CREAM 90 g 0   Cyanocobalamin (VITAMIN B12 PO) Take by mouth.     diclofenac sodium (VOLTAREN) 1 % GEL Apply 1 application topically 4 (four) times daily. 100 g 3   dicyclomine (BENTYL) 20 MG tablet Take 1 tablet (20 mg total) by mouth every 6 (six) hours as needed for spasms. 20 tablet 0   diphenoxylate-atropine (LOMOTIL) 2.5-0.025 MG tablet Take 1-2 tablets by mouth 4 (four) times daily as needed for diarrhea or loose stools. 16 tablet 0   guaiFENesin (MUCINEX) 600 MG 12 hr tablet Take 2 tablets (1,200 mg total) by mouth 2 (two) times daily as needed. 60 tablet 1   nitroGLYCERIN (NITRO-DUR) 0.1 mg/hr patch 1/4 patch daily 30 patch 12   Omega-3 Fatty Acids (FISH OIL PO) Take by mouth.     omeprazole (PRILOSEC) 20 MG capsule Take 1 capsule (20 mg total) by mouth daily. 90 capsule 3   sildenafil (VIAGRA) 100 MG tablet TAKE ONE TABLET BY MOUTH ONE TIME DAILY AS NEEDED FOR ED. MAX '100MG'$  (1 TABLET) PER 24 HOURS 10 tablet 0   triamcinolone ointment (KENALOG) 0.5 % Apply 1 application topically 2 (two) times daily. 30 g 2   zolpidem (AMBIEN) 10 MG tablet TAKE 1 TABLET(10 MG) BY MOUTH AT BEDTIME AS NEEDED FOR SLEEP 30 tablet 0   clonazePAM (KLONOPIN) 0.5 MG tablet TAKE 1 TABLET(0.5 MG) BY MOUTH TWICE DAILY AS NEEDED FOR ANXIETY OR SPASMS 60 tablet 0   HYDROcodone-acetaminophen (NORCO/VICODIN) 5-325 MG tablet Take 1 tablet by mouth 2 (two) times  daily as needed for severe pain. 60 tablet 0   valsartan (DIOVAN) 160 MG tablet TAKE 1 TABLET(160 MG) BY MOUTH DAILY 90 tablet 3   No facility-administered medications prior to visit.    ROS: Review of Systems  Constitutional:  Positive for fatigue. Negative for appetite change and unexpected weight change.  HENT:  Negative for congestion, nosebleeds, sneezing, sore throat and trouble swallowing.   Eyes:  Negative for itching and visual disturbance.  Respiratory:  Negative for cough.   Cardiovascular:  Negative for chest pain, palpitations and leg swelling.  Gastrointestinal:  Negative for abdominal distention, blood in stool, diarrhea and nausea.  Genitourinary:  Negative for frequency and hematuria.  Musculoskeletal:  Negative for back pain, gait problem, joint swelling and neck pain.  Skin:  Negative for rash.  Neurological:  Negative for dizziness, tremors, speech difficulty and weakness.  Psychiatric/Behavioral:  Positive for sleep disturbance. Negative for agitation, dysphoric mood and suicidal ideas. The patient is nervous/anxious.     Objective:  BP (!) 160/110 (BP Location: Right Arm, Patient Position: Sitting, Cuff Size: Normal)   Pulse 76   Temp 98.6 F (37 C) (Oral)   Ht '5\' 11"'$  (1.803 m)   Wt 180 lb (81.6 kg)   SpO2 97%   BMI 25.10 kg/m   BP Readings  from Last 3 Encounters:  08/23/21 (!) 160/110  05/20/21 (!) 171/98  02/11/21 122/70    Wt Readings from Last 3 Encounters:  08/23/21 180 lb (81.6 kg)  02/11/21 193 lb 9.6 oz (87.8 kg)  10/05/20 202 lb (91.6 kg)    Physical Exam Constitutional:      General: He is not in acute distress.    Appearance: He is well-developed.     Comments: NAD  Eyes:     Conjunctiva/sclera: Conjunctivae normal.     Pupils: Pupils are equal, round, and reactive to light.  Neck:     Thyroid: No thyromegaly.     Vascular: No JVD.  Cardiovascular:     Rate and Rhythm: Normal rate and regular rhythm.     Heart sounds: Normal  heart sounds. No murmur heard.    No friction rub. No gallop.  Pulmonary:     Effort: Pulmonary effort is normal. No respiratory distress.     Breath sounds: Normal breath sounds. No wheezing or rales.  Chest:     Chest wall: No tenderness.  Abdominal:     General: Bowel sounds are normal. There is no distension.     Palpations: Abdomen is soft. There is no mass.     Tenderness: There is no abdominal tenderness. There is no guarding or rebound.  Musculoskeletal:        General: No tenderness. Normal range of motion.     Cervical back: Normal range of motion.  Lymphadenopathy:     Cervical: No cervical adenopathy.  Skin:    General: Skin is warm and dry.     Findings: No rash.  Neurological:     Mental Status: He is alert and oriented to person, place, and time.     Cranial Nerves: No cranial nerve deficit.     Motor: No abnormal muscle tone.     Coordination: Coordination normal.     Gait: Gait normal.     Deep Tendon Reflexes: Reflexes are normal and symmetric.  Psychiatric:        Behavior: Behavior normal.        Thought Content: Thought content normal.        Judgment: Judgment normal.   LS stiff  Lab Results  Component Value Date   WBC 6.9 02/11/2021   HGB 16.7 02/11/2021   HCT 48.2 02/11/2021   PLT 209.0 02/11/2021   GLUCOSE 96 02/11/2021   CHOL 201 (H) 02/11/2021   TRIG 212.0 (H) 02/11/2021   HDL 31.80 (L) 02/11/2021   LDLDIRECT 159.0 02/11/2021   LDLCALC 130 (H) 02/11/2020   ALT 50 02/11/2021   AST 43 (H) 02/11/2021   NA 138 02/11/2021   K 3.7 02/11/2021   CL 101 02/11/2021   CREATININE 1.35 02/11/2021   BUN 10 02/11/2021   CO2 26 02/11/2021   TSH 2.01 02/11/2021   PSA 0.27 02/11/2021   HGBA1C 5.1 05/12/2009    No results found.  Assessment & Plan:   Problem List Items Addressed This Visit     Anxiety     Clonazepam prn rare. Not to take w/Zolpidem  Potential benefits of a long term benzodiazepines  use as well as potential risks  and  complications were explained to the patient and were aknowledged.      Essential hypertension    Start Micardis and Norvasc      Relevant Medications   telmisartan (MICARDIS) 80 MG tablet   amLODipine (NORVASC) 5 MG tablet   Insomnia  Chronic  Zolpidem  Potential benefits of a long term benzodiazepines  use as well as potential risks  and complications were explained to the patient and were aknowledged.         Meds ordered this encounter  Medications   telmisartan (MICARDIS) 80 MG tablet    Sig: Take 1 tablet (80 mg total) by mouth daily.    Dispense:  90 tablet    Refill:  3   amLODipine (NORVASC) 5 MG tablet    Sig: Take 1 tablet (5 mg total) by mouth daily.    Dispense:  90 tablet    Refill:  3   HYDROcodone-acetaminophen (NORCO/VICODIN) 5-325 MG tablet    Sig: Take 1 tablet by mouth 2 (two) times daily as needed for severe pain.    Dispense:  60 tablet    Refill:  0   clonazePAM (KLONOPIN) 0.5 MG tablet    Sig: TAKE 1 TABLET(0.5 MG) BY MOUTH TWICE DAILY AS NEEDED FOR ANXIETY OR SPASMS    Dispense:  60 tablet    Refill:  1      Follow-up: Return in about 3 months (around 11/23/2021) for a follow-up visit.  Walker Kehr, MD

## 2021-09-06 ENCOUNTER — Other Ambulatory Visit: Payer: Self-pay | Admitting: Internal Medicine

## 2021-09-07 NOTE — Telephone Encounter (Signed)
Check [place] registry last filled 08/09/2021../l,mb

## 2021-09-09 ENCOUNTER — Other Ambulatory Visit: Payer: Self-pay | Admitting: Internal Medicine

## 2021-09-13 MED ORDER — ZOLPIDEM TARTRATE 10 MG PO TABS: ORAL_TABLET | ORAL | 0 refills | Status: DC

## 2021-10-07 ENCOUNTER — Other Ambulatory Visit: Payer: Self-pay | Admitting: Internal Medicine

## 2021-10-07 NOTE — Telephone Encounter (Signed)
Check [place] registry last filled 08/23/2021. MD out of the office until Monday 10/11/21. Pls advise on refill.Marland KitchenJohny Chess

## 2021-10-07 NOTE — Telephone Encounter (Signed)
I do not see any pain contract for this patient and no documentation in the last year or more on how he is supposed to take this medication. In review of [place] database he is filling this medication on average every 2-3 months so likely can wait for PCP return and I would strongly recommend visit to establish controlled substance contract (since he has 3 different controlled substances) as well as UDS screening. I will not fill as PCP will return within 3 business days

## 2021-10-13 MED ORDER — HYDROCODONE-ACETAMINOPHEN 5-325 MG PO TABS
1.0000 | ORAL_TABLET | Freq: Two times a day (BID) | ORAL | 0 refills | Status: DC | PRN
Start: 2021-10-13 — End: 2021-11-23

## 2021-10-15 ENCOUNTER — Other Ambulatory Visit: Payer: Self-pay | Admitting: Internal Medicine

## 2021-11-23 ENCOUNTER — Ambulatory Visit: Payer: BC Managed Care – PPO | Admitting: Internal Medicine

## 2021-11-23 ENCOUNTER — Encounter: Payer: Self-pay | Admitting: Internal Medicine

## 2021-11-23 DIAGNOSIS — R748 Abnormal levels of other serum enzymes: Secondary | ICD-10-CM

## 2021-11-23 DIAGNOSIS — F419 Anxiety disorder, unspecified: Secondary | ICD-10-CM

## 2021-11-23 DIAGNOSIS — Z23 Encounter for immunization: Secondary | ICD-10-CM

## 2021-11-23 DIAGNOSIS — M542 Cervicalgia: Secondary | ICD-10-CM

## 2021-11-23 DIAGNOSIS — G4709 Other insomnia: Secondary | ICD-10-CM | POA: Diagnosis not present

## 2021-11-23 DIAGNOSIS — R944 Abnormal results of kidney function studies: Secondary | ICD-10-CM

## 2021-11-23 DIAGNOSIS — I10 Essential (primary) hypertension: Secondary | ICD-10-CM

## 2021-11-23 DIAGNOSIS — R140 Abdominal distension (gaseous): Secondary | ICD-10-CM | POA: Insufficient documentation

## 2021-11-23 DIAGNOSIS — B351 Tinea unguium: Secondary | ICD-10-CM

## 2021-11-23 MED ORDER — CLONAZEPAM 0.5 MG PO TABS: ORAL_TABLET | ORAL | 1 refills | Status: DC

## 2021-11-23 MED ORDER — METRONIDAZOLE 500 MG PO TABS: 500.0000 mg | ORAL_TABLET | Freq: Three times a day (TID) | ORAL | 0 refills | Status: DC

## 2021-11-23 MED ORDER — HYDROCODONE-ACETAMINOPHEN 5-325 MG PO TABS
1.0000 | ORAL_TABLET | Freq: Two times a day (BID) | ORAL | 0 refills | Status: DC | PRN
Start: 2021-11-23 — End: 2022-01-28

## 2021-11-23 MED ORDER — SACCHAROMYCES BOULARDII 250 MG PO CAPS: 250.0000 mg | ORAL_CAPSULE | Freq: Two times a day (BID) | ORAL | 1 refills | Status: DC

## 2021-11-23 MED ORDER — ZOLPIDEM TARTRATE 10 MG PO TABS: ORAL_TABLET | ORAL | 2 refills | Status: DC

## 2021-11-23 MED ORDER — CICLOPIROX 8 % EX SOLN: Freq: Every day | CUTANEOUS | 1 refills | Status: DC

## 2021-11-23 NOTE — Assessment & Plan Note (Signed)
Improve hydration. 

## 2021-11-23 NOTE — Assessment & Plan Note (Signed)
Zolpidem  Potential benefits of a long term benzodiazepines  use as well as potential risks  and complications were explained to the patient and were aknowledged.  

## 2021-11-23 NOTE — Patient Instructions (Signed)
Wart medication

## 2021-11-23 NOTE — Assessment & Plan Note (Signed)
Chronic Given Flagyl and Florastor

## 2021-11-23 NOTE — Assessment & Plan Note (Signed)
  Clonazepam prn rare. Not to take w/Zolpidem  Potential benefits of a long term benzodiazepines  use as well as potential risks  and complications were explained to the patient and were aknowledged.

## 2021-11-23 NOTE — Assessment & Plan Note (Signed)
L>R  MSK (B) Mild OA on x ray Norco prn  Potential benefits of a long term opioids use as well as potential risks (i.e. addiction risk, apnea etc) and complications (i.e. Somnolence, constipation and others) were explained to the patient and were aknowledged.

## 2021-11-23 NOTE — Assessment & Plan Note (Signed)
LFT

## 2021-11-23 NOTE — Assessment & Plan Note (Signed)
Start [place] qd

## 2021-11-23 NOTE — Assessment & Plan Note (Signed)
On Micardis and Norvasc

## 2021-11-23 NOTE — Progress Notes (Signed)
Subjective:  Patient ID: Dale Campbell, male    DOB: 25-Nov-1960  Age: 61 y.o. MRN: [id number]  CC: Follow-up (3 month f/u- Flu shot)   HPI Dale Campbell presents for HTN, anxiety, insomnia, neck pain C/o gas, loose stools x 12 months C/o nail deformities  Outpatient Medications Prior to Visit  Medication Sig Dispense Refill  . Ascorbic Acid (VITAMIN C PO) Take by mouth.    . Cholecalciferol 1000 UNITS capsule Take 1,000 Units by mouth daily.    . clotrimazole-betamethasone (LOTRISONE) cream APPLY TO THE AFFECTED AREA TWICE DAILY . USE FOR 2 WEEKS THEN SWITCH TO KETOCONAZOLE CREAM 90 g 0  . Cyanocobalamin (VITAMIN B12 PO) Take by mouth.    . diclofenac sodium (VOLTAREN) 1 % GEL Apply 1 application topically 4 (four) times daily. 100 g 3  . dicyclomine (BENTYL) 20 MG tablet Take 1 tablet (20 mg total) by mouth every 6 (six) hours as needed for spasms. 20 tablet 0  . diphenoxylate-atropine (LOMOTIL) 2.5-0.025 MG tablet Take 1-2 tablets by mouth 4 (four) times daily as needed for diarrhea or loose stools. 16 tablet 0  . guaiFENesin (MUCINEX) 600 MG 12 hr tablet Take 2 tablets (1,200 mg total) by mouth 2 (two) times daily as needed. 60 tablet 1  . nitroGLYCERIN (NITRO-DUR) 0.1 mg/hr patch 1/4 patch daily 30 patch 12  . Omega-3 Fatty Acids (FISH OIL PO) Take by mouth.    Marland Kitchen omeprazole (PRILOSEC) 20 MG capsule Take 1 capsule (20 mg total) by mouth daily. 90 capsule 3  . sildenafil (VIAGRA) 100 MG tablet TAKE ONE TABLET BY MOUTH ONE TIME DAILY AS NEEDED FOR ED. MAX '100MG'$  (1 TABLET) PER 24 HOURS 10 tablet 0  . telmisartan (MICARDIS) 80 MG tablet Take 1 tablet (80 mg total) by mouth daily. 90 tablet 3  . triamcinolone ointment (KENALOG) 0.5 % Apply 1 application topically 2 (two) times daily. 30 g 2  . amLODipine (NORVASC) 5 MG tablet Take 1 tablet (5 mg total) by mouth daily. 90 tablet 3  . clonazePAM (KLONOPIN) 0.5 MG tablet TAKE 1 TABLET(0.5 MG) BY MOUTH TWICE DAILY AS NEEDED FOR  ANXIETY OR SPASMS 60 tablet 1  . HYDROcodone-acetaminophen (NORCO/VICODIN) 5-325 MG tablet Take 1 tablet by mouth 2 (two) times daily as needed for severe pain. 60 tablet 0  . zolpidem (AMBIEN) 10 MG tablet TAKE 1 TABLET(10 MG) BY MOUTH AT BEDTIME AS NEEDED FOR SLEEP 30 tablet 1   No facility-administered medications prior to visit.    ROS: Review of Systems  Constitutional:  Negative for appetite change, fatigue and unexpected weight change.  HENT:  Negative for congestion, nosebleeds, sneezing, sore throat and trouble swallowing.   Eyes:  Negative for itching and visual disturbance.  Respiratory:  Negative for cough.   Cardiovascular:  Negative for chest pain, palpitations and leg swelling.  Gastrointestinal:  Negative for abdominal distention, blood in stool, diarrhea and nausea.  Genitourinary:  Negative for frequency and hematuria.  Musculoskeletal:  Positive for arthralgias, back pain and neck pain. Negative for gait problem and joint swelling.  Skin:  Negative for rash.  Neurological:  Negative for dizziness, tremors, speech difficulty and weakness.  Psychiatric/Behavioral:  Negative for agitation, dysphoric mood, sleep disturbance and suicidal ideas. The patient is nervous/anxious.     Objective:  BP (!) 140/82 (BP Location: Left Arm)   Pulse 65   Temp 98 F (36.7 C) (Oral)   Ht '5\' 11"'$  (1.803 m)   Wt 185 lb (  83.9 kg)   SpO2 97%   BMI 25.80 kg/m   BP Readings from Last 3 Encounters:  11/23/21 (!) 140/82  08/23/21 (!) 160/110  05/20/21 (!) 171/98    Wt Readings from Last 3 Encounters:  11/23/21 185 lb (83.9 kg)  08/23/21 180 lb (81.6 kg)  02/11/21 193 lb 9.6 oz (87.8 kg)    Physical Exam Constitutional:      General: He is not in acute distress.    Appearance: Normal appearance. He is well-developed.     Comments: NAD  Eyes:     Conjunctiva/sclera: Conjunctivae normal.     Pupils: Pupils are equal, round, and reactive to light.  Neck:     Thyroid: No  thyromegaly.     Vascular: No JVD.  Cardiovascular:     Rate and Rhythm: Normal rate and regular rhythm.     Heart sounds: Normal heart sounds. No murmur heard.    No friction rub. No gallop.  Pulmonary:     Effort: Pulmonary effort is normal. No respiratory distress.     Breath sounds: Normal breath sounds. No wheezing or rales.  Chest:     Chest wall: No tenderness.  Abdominal:     General: Bowel sounds are normal. There is no distension.     Palpations: Abdomen is soft. There is no mass.     Tenderness: There is no abdominal tenderness. There is no guarding or rebound.  Musculoskeletal:        General: No tenderness. Normal range of motion.     Cervical back: Normal range of motion.  Lymphadenopathy:     Cervical: No cervical adenopathy.  Skin:    General: Skin is warm and dry.     Findings: No rash.  Neurological:     Mental Status: He is alert and oriented to person, place, and time.     Cranial Nerves: No cranial nerve deficit.     Motor: No abnormal muscle tone.     Coordination: Coordination normal.     Gait: Gait normal.     Deep Tendon Reflexes: Reflexes are normal and symmetric.  Psychiatric:        Behavior: Behavior normal.        Thought Content: Thought content normal.        Judgment: Judgment normal.  Fingernails w/onycho     A total time of 45 minutes was spent preparing to see the patient, reviewing tests, x-rays, operative reports and other medical records.  Also, obtaining history and performing comprehensive physical exam.  Additionally, counseling the patient regarding the above listed issues.   Finally, documenting clinical information in the health records, coordination of care, educating the patient pain meds, onycho, insomnia, gas   Lab Results  Component Value Date   WBC 6.9 02/11/2021   HGB 16.7 02/11/2021   HCT 48.2 02/11/2021   PLT 209.0 02/11/2021   GLUCOSE 96 02/11/2021   CHOL 201 (H) 02/11/2021   TRIG 212.0 (H) 02/11/2021   HDL 31.80  (L) 02/11/2021   LDLDIRECT 159.0 02/11/2021   LDLCALC 130 (H) 02/11/2020   ALT 50 02/11/2021   AST 43 (H) 02/11/2021   NA 138 02/11/2021   K 3.7 02/11/2021   CL 101 02/11/2021   CREATININE 1.35 02/11/2021   BUN 10 02/11/2021   CO2 26 02/11/2021   TSH 2.01 02/11/2021   PSA 0.27 02/11/2021   HGBA1C 5.1 05/12/2009    No results found.  Assessment & Plan:   Problem List Items  Addressed This Visit     Anxiety     Clonazepam prn rare. Not to take w/Zolpidem  Potential benefits of a long term benzodiazepines  use as well as potential risks  and complications were explained to the patient and were aknowledged.      Decreased GFR    Improve hydration      Elevated liver enzymes    LFT      Essential hypertension    On Micardis and Norvasc      Insomnia    Zolpidem  Potential benefits of a long term benzodiazepines  use as well as potential risks  and complications were explained to the patient and were aknowledged.      Meteorism    Chronic Given Flagyl and Florastor      Neck pain    L>R  MSK (B) Mild OA on x ray Norco prn  Potential benefits of a long term opioids use as well as potential risks (i.e. addiction risk, apnea etc) and complications (i.e. Somnolence, constipation and others) were explained to the patient and were aknowledged.      Onychomycosis    Start Penlac qd      Relevant Medications   metroNIDAZOLE (FLAGYL) 500 MG tablet   ciclopirox (PENLAC) 8 % solution      Meds ordered this encounter  Medications  . clonazePAM (KLONOPIN) 0.5 MG tablet    Sig: TAKE 1 TABLET(0.5 MG) BY MOUTH TWICE DAILY AS NEEDED FOR ANXIETY OR SPASMS    Dispense:  60 tablet    Refill:  1  . HYDROcodone-acetaminophen (NORCO/VICODIN) 5-325 MG tablet    Sig: Take 1 tablet by mouth 2 (two) times daily as needed for severe pain.    Dispense:  60 tablet    Refill:  0  . zolpidem (AMBIEN) 10 MG tablet    Sig: TAKE 1 TABLET(10 MG) BY MOUTH AT BEDTIME AS NEEDED FOR  SLEEP    Dispense:  30 tablet    Refill:  2  . metroNIDAZOLE (FLAGYL) 500 MG tablet    Sig: Take 1 tablet (500 mg total) by mouth 3 (three) times daily.    Dispense:  30 tablet    Refill:  0  . saccharomyces boulardii (FLORASTOR) 250 MG capsule    Sig: Take 1 capsule (250 mg total) by mouth 2 (two) times daily.    Dispense:  60 capsule    Refill:  1  . ciclopirox (PENLAC) 8 % solution    Sig: Apply topically at bedtime. Apply over nail and surrounding skin daily    Dispense:  6.6 mL    Refill:  1      Follow-up: Return in about 3 months (around 02/22/2022) for a follow-up visit.  Walker Kehr, MD

## 2022-01-28 ENCOUNTER — Other Ambulatory Visit: Payer: Self-pay | Admitting: Internal Medicine

## 2022-02-01 MED ORDER — HYDROCODONE-ACETAMINOPHEN 5-325 MG PO TABS: 1.0000 | ORAL_TABLET | Freq: Two times a day (BID) | ORAL | 0 refills | Status: DC | PRN

## 2022-02-11 ENCOUNTER — Other Ambulatory Visit: Payer: BC Managed Care – PPO

## 2022-02-14 ENCOUNTER — Encounter: Payer: Self-pay | Admitting: Internal Medicine

## 2022-02-14 ENCOUNTER — Ambulatory Visit (INDEPENDENT_AMBULATORY_CARE_PROVIDER_SITE_OTHER): Payer: BC Managed Care – PPO | Admitting: Internal Medicine

## 2022-02-14 VITALS — BP 130/78 | HR 62 | Temp 98.3°F | Ht 71.0 in | Wt 188.0 lb

## 2022-02-14 DIAGNOSIS — Z23 Encounter for immunization: Secondary | ICD-10-CM

## 2022-02-14 DIAGNOSIS — G4709 Other insomnia: Secondary | ICD-10-CM | POA: Diagnosis not present

## 2022-02-14 DIAGNOSIS — E785 Hyperlipidemia, unspecified: Secondary | ICD-10-CM | POA: Diagnosis not present

## 2022-02-14 DIAGNOSIS — Z0000 Encounter for general adult medical examination without abnormal findings: Secondary | ICD-10-CM

## 2022-02-14 DIAGNOSIS — R944 Abnormal results of kidney function studies: Secondary | ICD-10-CM

## 2022-02-14 LAB — COMPREHENSIVE METABOLIC PANEL
ALT: 25 U/L (ref 0–53)
AST: 23 U/L (ref 0–37)
Albumin: 4.5 g/dL (ref 3.5–5.2)
Alkaline Phosphatase: 60 U/L (ref 39–117)
BUN: 9 mg/dL (ref 6–23)
CO2: 30 mEq/L (ref 19–32)
Calcium: 9.5 mg/dL (ref 8.4–10.5)
Chloride: 103 mEq/L (ref 96–112)
Creatinine, Ser: 0.98 mg/dL (ref 0.40–1.50)
GFR: 83.51 mL/min (ref >60.00)
Glucose, Bld: 91 mg/dL (ref 70–99)
Potassium: 3.9 mEq/L (ref 3.5–5.1)
Sodium: 140 mEq/L (ref 135–145)
Total Bilirubin: 0.9 mg/dL (ref 0.2–1.2)
Total Protein: 7.6 g/dL (ref 6.0–8.3)

## 2022-02-14 LAB — LIPID PANEL
Cholesterol: 188 mg/dL (ref 0–200)
HDL: 42.1 mg/dL (ref >39.00)
LDL Cholesterol: 127 mg/dL — ABNORMAL HIGH (ref 0–99)
NonHDL: 146.29
Total CHOL/HDL Ratio: 4
Triglycerides: 96 mg/dL (ref 0.0–149.0)
VLDL: 19.2 mg/dL (ref 0.0–40.0)

## 2022-02-14 LAB — CBC WITH DIFFERENTIAL/PLATELET
Basophils Absolute: 0 10*3/uL (ref 0.0–0.1)
Basophils Relative: 0.8 % (ref 0.0–3.0)
Eosinophils Absolute: 0.1 10*3/uL (ref 0.0–0.7)
Eosinophils Relative: 2.1 % (ref 0.0–5.0)
HCT: 45.6 % (ref 39.0–52.0)
Hemoglobin: 15.9 g/dL (ref 13.0–17.0)
Lymphocytes Relative: 32.8 % (ref 12.0–46.0)
Lymphs Abs: 1.9 10*3/uL (ref 0.7–4.0)
MCHC: 35 g/dL (ref 30.0–36.0)
MCV: 90.5 fl (ref 78.0–100.0)
Monocytes Absolute: 0.5 10*3/uL (ref 0.1–1.0)
Monocytes Relative: 8.2 % (ref 3.0–12.0)
Neutro Abs: 3.2 10*3/uL (ref 1.4–7.7)
Neutrophils Relative %: 56.1 % (ref 43.0–77.0)
Platelets: 161 10*3/uL (ref 150.0–400.0)
RBC: 5.04 Mil/uL (ref 4.22–5.81)
RDW: 12.2 % (ref 11.5–15.5)
WBC: 5.8 10*3/uL (ref 4.0–10.5)

## 2022-02-14 LAB — TSH: TSH: 1.93 u[IU]/mL (ref 0.35–5.50)

## 2022-02-14 MED ORDER — OMEPRAZOLE 20 MG PO CPDR: 20.0000 mg | DELAYED_RELEASE_CAPSULE | Freq: Every day | ORAL | 3 refills | Status: DC

## 2022-02-14 NOTE — Assessment & Plan Note (Addendum)
We discussed age appropriate health related issues, including available/recomended screening tests and vaccinations. We discussed a need for adhering to healthy diet and exercise. Labs/EKG were reviewed/ordered. All questions were answered. Cardiac CT calcium scoring offered 2019, 2022 Shingrix 2019 Colon 1/15 due in 2020; 2022; due in 2027 Dr Carlean Purl A cardiac CT scan for calcium scoring offered

## 2022-02-14 NOTE — Progress Notes (Signed)
Subjective:  Patient ID: Dale Campbell, male    DOB: 05-12-1960  Age: 61 y.o. MRN: [id number]  CC: Annual Exam   HPI THAI BURGUENO presents for well exam  Outpatient Medications Prior to Visit  Medication Sig Dispense Refill   Ascorbic Acid (VITAMIN C PO) Take by mouth.     Cholecalciferol 1000 UNITS capsule Take 1,000 Units by mouth daily.     ciclopirox (PENLAC) 8 % solution Apply topically at bedtime. Apply over nail and surrounding skin daily 6.6 mL 1   clonazePAM (KLONOPIN) 0.5 MG tablet TAKE 1 TABLET(0.5 MG) BY MOUTH TWICE DAILY AS NEEDED FOR ANXIETY OR SPASMS 60 tablet 1   clotrimazole-betamethasone (LOTRISONE) cream APPLY TO THE AFFECTED AREA TWICE DAILY . USE FOR 2 WEEKS THEN SWITCH TO KETOCONAZOLE CREAM 90 g 0   Cyanocobalamin (VITAMIN B12 PO) Take by mouth.     diclofenac sodium (VOLTAREN) 1 % GEL Apply 1 application topically 4 (four) times daily. 100 g 3   dicyclomine (BENTYL) 20 MG tablet Take 1 tablet (20 mg total) by mouth every 6 (six) hours as needed for spasms. 20 tablet 0   diphenoxylate-atropine (LOMOTIL) 2.5-0.025 MG tablet Take 1-2 tablets by mouth 4 (four) times daily as needed for diarrhea or loose stools. 16 tablet 0   guaiFENesin (MUCINEX) 600 MG 12 hr tablet Take 2 tablets (1,200 mg total) by mouth 2 (two) times daily as needed. 60 tablet 1   HYDROcodone-acetaminophen (NORCO/VICODIN) 5-325 MG tablet Take 1 tablet by mouth 2 (two) times daily as needed for severe pain. 60 tablet 0   metroNIDAZOLE (FLAGYL) 500 MG tablet Take 1 tablet (500 mg total) by mouth 3 (three) times daily. 30 tablet 0   nitroGLYCERIN (NITRO-DUR) 0.1 mg/hr patch 1/4 patch daily 30 patch 12   Omega-3 Fatty Acids (FISH OIL PO) Take by mouth.     saccharomyces boulardii (FLORASTOR) 250 MG capsule Take 1 capsule (250 mg total) by mouth 2 (two) times daily. 60 capsule 1   sildenafil (VIAGRA) 100 MG tablet TAKE ONE TABLET BY MOUTH ONE TIME DAILY AS NEEDED FOR ED. MAX '100MG'$  (1 TABLET) PER  24 HOURS 10 tablet 0   telmisartan (MICARDIS) 80 MG tablet Take 1 tablet (80 mg total) by mouth daily. 90 tablet 3   triamcinolone ointment (KENALOG) 0.5 % Apply 1 application topically 2 (two) times daily. 30 g 2   zolpidem (AMBIEN) 10 MG tablet TAKE 1 TABLET(10 MG) BY MOUTH AT BEDTIME AS NEEDED FOR SLEEP 30 tablet 2   omeprazole (PRILOSEC) 20 MG capsule Take 1 capsule (20 mg total) by mouth daily. 90 capsule 3   No facility-administered medications prior to visit.    ROS: Review of Systems  Constitutional:  Negative for appetite change, fatigue and unexpected weight change.  HENT:  Negative for congestion, nosebleeds, sneezing, sore throat and trouble swallowing.   Eyes:  Negative for itching and visual disturbance.  Respiratory:  Negative for cough.   Cardiovascular:  Negative for chest pain, palpitations and leg swelling.  Gastrointestinal:  Negative for abdominal distention, blood in stool, diarrhea and nausea.  Genitourinary:  Negative for frequency and hematuria.  Musculoskeletal:  Positive for back pain. Negative for gait problem, joint swelling and neck pain.  Skin:  Negative for rash.  Neurological:  Negative for dizziness, tremors, speech difficulty and weakness.  Psychiatric/Behavioral:  Positive for sleep disturbance. Negative for agitation, dysphoric mood and suicidal ideas. The patient is not nervous/anxious.     Objective:  BP 130/78 (BP Location: Left Arm, Patient Position: Sitting, Cuff Size: Normal)   Pulse 62   Temp 98.3 F (36.8 C) (Oral)   Ht '5\' 11"'$  (1.803 m)   Wt 188 lb (85.3 kg)   SpO2 98%   BMI 26.22 kg/m   BP Readings from Last 3 Encounters:  02/14/22 130/78  11/23/21 (!) 140/82  08/23/21 (!) 160/110    Wt Readings from Last 3 Encounters:  02/14/22 188 lb (85.3 kg)  11/23/21 185 lb (83.9 kg)  08/23/21 180 lb (81.6 kg)    Physical Exam Constitutional:      General: He is not in acute distress.    Appearance: Normal appearance. He is  well-developed.     Comments: NAD  Eyes:     Conjunctiva/sclera: Conjunctivae normal.     Pupils: Pupils are equal, round, and reactive to light.  Neck:     Thyroid: No thyromegaly.     Vascular: No JVD.  Cardiovascular:     Rate and Rhythm: Normal rate and regular rhythm.     Heart sounds: Normal heart sounds. No murmur heard.    No friction rub. No gallop.  Pulmonary:     Effort: Pulmonary effort is normal. No respiratory distress.     Breath sounds: Normal breath sounds. No wheezing or rales.  Chest:     Chest wall: No tenderness.  Abdominal:     General: Bowel sounds are normal. There is no distension.     Palpations: Abdomen is soft. There is no mass.     Tenderness: There is no abdominal tenderness. There is no guarding or rebound.  Musculoskeletal:        General: No tenderness. Normal range of motion.     Cervical back: Normal range of motion.  Lymphadenopathy:     Cervical: No cervical adenopathy.  Skin:    General: Skin is warm and dry.     Findings: No rash.  Neurological:     Mental Status: He is alert and oriented to person, place, and time.     Cranial Nerves: No cranial nerve deficit.     Motor: No abnormal muscle tone.     Coordination: Coordination normal.     Gait: Gait normal.     Deep Tendon Reflexes: Reflexes are normal and symmetric.  Psychiatric:        Behavior: Behavior normal.        Thought Content: Thought content normal.        Judgment: Judgment normal.   Rectal - per GI  Lab Results  Component Value Date   WBC 6.9 02/11/2021   HGB 16.7 02/11/2021   HCT 48.2 02/11/2021   PLT 209.0 02/11/2021   GLUCOSE 96 02/11/2021   CHOL 201 (H) 02/11/2021   TRIG 212.0 (H) 02/11/2021   HDL 31.80 (L) 02/11/2021   LDLDIRECT 159.0 02/11/2021   LDLCALC 130 (H) 02/11/2020   ALT 50 02/11/2021   AST 43 (H) 02/11/2021   NA 138 02/11/2021   K 3.7 02/11/2021   CL 101 02/11/2021   CREATININE 1.35 02/11/2021   BUN 10 02/11/2021   CO2 26 02/11/2021    TSH 2.01 02/11/2021   PSA 0.27 02/11/2021   HGBA1C 5.1 05/12/2009    No results found.  Assessment & Plan:   Problem List Items Addressed This Visit     Decreased GFR     Improve hydration.      Dyslipidemia   Insomnia    Chronic On Zolpidem  Potential benefits of a long term benzodiazepines  use as well as potential risks  and complications were explained to the patient and were aknowledged.      Well adult exam - Primary    We discussed age appropriate health related issues, including available/recomended screening tests and vaccinations. We discussed a need for adhering to healthy diet and exercise. Labs/EKG were reviewed/ordered. All questions were answered. Cardiac CT calcium scoring offered 2019, 2022 Shingrix 2019 Colon 1/15 due in 2020; 2022; due in 2027 Dr Carlean Purl A cardiac CT scan for calcium scoring offered       Relevant Orders   TSH   Urinalysis   CBC with Differential/Platelet   Lipid panel   Comprehensive metabolic panel      Meds ordered this encounter  Medications   omeprazole (PRILOSEC) 20 MG capsule    Sig: Take 1 capsule (20 mg total) by mouth daily.    Dispense:  90 capsule    Refill:  3      Follow-up: Return in about 6 months (around 08/16/2022) for a follow-up visit.  Walker Kehr, MD

## 2022-02-14 NOTE — Assessment & Plan Note (Signed)
Improve hydration.

## 2022-02-14 NOTE — Addendum Note (Signed)
Addended by: Basil Dess on: 02/14/2022 04:27 PM   Modules accepted: Orders

## 2022-02-14 NOTE — Assessment & Plan Note (Signed)
Chronic On Zolpidem  Potential benefits of a long term benzodiazepines  use as well as potential risks  and complications were explained to the patient and were aknowledged.

## 2022-02-15 LAB — URINALYSIS
Bilirubin Urine: NEGATIVE
Hgb urine dipstick: NEGATIVE
Ketones, ur: NEGATIVE
Leukocytes,Ua: NEGATIVE
Nitrite: NEGATIVE
Specific Gravity, Urine: >=1.03 — AB (ref 1.000–1.030)
Total Protein, Urine: NEGATIVE
Urine Glucose: NEGATIVE
Urobilinogen, UA: 0.2 (ref 0.0–1.0)
pH: 5.5 (ref 5.0–8.0)

## 2022-03-08 ENCOUNTER — Other Ambulatory Visit: Payer: Self-pay | Admitting: Internal Medicine

## 2022-03-11 MED ORDER — HYDROCODONE-ACETAMINOPHEN 5-325 MG PO TABS: 1.0000 | ORAL_TABLET | Freq: Two times a day (BID) | ORAL | 0 refills | Status: DC | PRN

## 2022-03-15 ENCOUNTER — Other Ambulatory Visit: Payer: Self-pay | Admitting: Internal Medicine

## 2022-03-31 ENCOUNTER — Other Ambulatory Visit: Payer: Self-pay | Admitting: Internal Medicine

## 2022-04-14 DIAGNOSIS — R197 Diarrhea, unspecified: Secondary | ICD-10-CM | POA: Diagnosis not present

## 2022-05-05 ENCOUNTER — Telehealth: Payer: Self-pay | Admitting: Internal Medicine

## 2022-05-05 DIAGNOSIS — R112 Nausea with vomiting, unspecified: Secondary | ICD-10-CM

## 2022-05-05 NOTE — Telephone Encounter (Signed)
Inbound call from patient stating that he has been having vomiting and diarrhea for the past 2 weeks and is requesting a call back to discuss if he can get medication to see if it will help. Please advise.

## 2022-05-05 NOTE — Telephone Encounter (Signed)
Left message for pt to call back  °

## 2022-05-05 NOTE — Telephone Encounter (Signed)
Pt stated that he has been having diarrhea and vomiting for 2 weeks now. Some days worse than others. Imodium not helping, Pepto Bismol not helping. Pt states that he has continuous abdominal pain. No fever Pt scheduled for an office visit on 05/11/2022 at 11:30 with Dr. Carlean Purl. Pt made aware Please advise if any recommendations prior to OV.

## 2022-05-06 MED ORDER — ONDANSETRON 4 MG PO TBDP: 4.0000 mg | ORAL_TABLET | Freq: Three times a day (TID) | ORAL | 0 refills | Status: DC | PRN

## 2022-05-06 NOTE — Telephone Encounter (Signed)
Pt made aware of Dr. Carlean Purl recommendations: Prescription sent to pharmacy. Pt made aware. Pt notified that If symptoms are severe and he is unable to function then go to ED  Pt verbalized understanding with all questions answered.

## 2022-05-06 NOTE — Telephone Encounter (Signed)
He should go on a BRAT diet and can take up to 6 Imodium in a day  Can Rx ondansetron 4 mg - I pended an RX  If symptoms are severe and he is unable to function then go to ED

## 2022-05-06 NOTE — Telephone Encounter (Signed)
Dale Campbell has an 1130 today if he can make that

## 2022-05-06 NOTE — Telephone Encounter (Signed)
Pt made aware of Appointment availability today at 11:30 AM.   Pt stated that he was unable to make that appointment today and is committed to his work schedule until next Wednesday.

## 2022-05-11 ENCOUNTER — Ambulatory Visit: Payer: BC Managed Care – PPO | Admitting: Internal Medicine

## 2022-05-11 ENCOUNTER — Other Ambulatory Visit: Payer: BC Managed Care – PPO

## 2022-05-11 ENCOUNTER — Encounter: Payer: Self-pay | Admitting: Internal Medicine

## 2022-05-11 VITALS — BP 132/76 | HR 94 | Ht 72.0 in | Wt 182.4 lb

## 2022-05-11 DIAGNOSIS — R112 Nausea with vomiting, unspecified: Secondary | ICD-10-CM

## 2022-05-11 DIAGNOSIS — R197 Diarrhea, unspecified: Secondary | ICD-10-CM

## 2022-05-11 DIAGNOSIS — F4320 Adjustment disorder, unspecified: Secondary | ICD-10-CM

## 2022-05-11 DIAGNOSIS — R1033 Periumbilical pain: Secondary | ICD-10-CM

## 2022-05-11 DIAGNOSIS — F419 Anxiety disorder, unspecified: Secondary | ICD-10-CM

## 2022-05-11 DIAGNOSIS — K219 Gastro-esophageal reflux disease without esophagitis: Secondary | ICD-10-CM | POA: Diagnosis not present

## 2022-05-11 DIAGNOSIS — R09A2 Foreign body sensation, throat: Secondary | ICD-10-CM

## 2022-05-11 MED ORDER — DICYCLOMINE HCL 20 MG PO TABS: 20.0000 mg | ORAL_TABLET | Freq: Three times a day (TID) | ORAL | 0 refills | Status: DC

## 2022-05-11 MED ORDER — PROMETHAZINE HCL 25 MG PO TABS: 25.0000 mg | ORAL_TABLET | Freq: Four times a day (QID) | ORAL | 0 refills | Status: DC | PRN

## 2022-05-11 NOTE — Progress Notes (Signed)
Dale Campbell 62 y.o. [birth year] [id number]  Assessment & Plan:   Encounter Diagnoses  Name Primary?   Nausea and vomiting, unspecified vomiting type Yes   Periumbilical abdominal pain    Gastroesophageal reflux disease without esophagitis    Diarrhea, unspecified type    Anxiety    Adjustment disorder, unspecified type    Globus sensation    I am suspicious of IBS and recent gastroenteritis. Alternatively could have IBD or celiac dz. There is definite anxiety and depressed mood and I think globus and other sxs related to these.  Orders Placed This Encounter  Procedures   Tissue transglutaminase, IgA   IgA   EGD to evaluate pain and hx GERD and reassurance. Consider CT pending results and clinical course.   Meds ordered this encounter  Medications   dicyclomine (BENTYL) 20 MG tablet    Sig: Take 1 tablet (20 mg total) by mouth 3 (three) times daily before meals.    Dispense:  90 tablet    Refill:  0   promethazine (PHENERGAN) 25 MG tablet    Sig: Take 1 tablet (25 mg total) by mouth every 6 (six) hours as needed for nausea or vomiting.    Dispense:  30 tablet    Refill:  0   Return to PCP to address anxiety, adjustment d/o Depending upon my w/u I find duloxetine can help functional GI d/o as well as anxiety and depression - buspirone may also be helpful  [id number], Evie Lacks, MD    Subjective:   Chief Complaint: abd pain, diarrhea, nausea and vomiting and bloating  HPI  62 yo wm known to me through colonoscopy exams only who called [place] last week w/ c/o nausea + vomiting and abdominal pain + diarrhea.  I did rx ondansetron but he says ineffective and had tried in past also.  Recent issues w. Frequent nausea and vomiting and diarrhea w/ bleeding + abdominal cramps and a background of chronic, daily periumbilical abdominal pain. The nausea, vomiting and diarrhea are improving but he says he has a lot of milder similar symptoms on a chronic basis.  No  rectal bleeding or hematochezia. No clear triggers. Not much adairy and no artificial sweeteners.  Drinks 6-7 Vodkas a week and no correlation. C/O globus.Excessive flatus.  Worried about stomach cancer after hearing about Toby Keith's death. Sig stressors - parents in 12's and they live in [place] and need help. He works in Chief Financial Officer at [employer] - about 1 hr commute 1 way. Lives alone w/ 4 dogs and 2 of them have diabetes.   Was in ED 3/23 w/ gastroenteritis dx Chronic GERD on PPI - no prior EGD, sxs seem controlled. Hx dicyclomine Rx but he cannot recall  + anhedonia Sleep +/- uses clonazepam prn Hx using Trintellix ut sexual performance issues and impacted relationship Wt Readings from Last 3 Encounters:  05/11/22 182 lb 6.4 oz (82.7 kg)  02/14/22 188 lb (85.3 kg)  11/23/21 185 lb (83.9 kg)   Lab Results  Component Value Date   WBC 5.8 02/14/2022   HGB 15.9 02/14/2022   HCT 45.6 02/14/2022   MCV 90.5 02/14/2022   PLT 161.0 02/14/2022     Chemistry      Component Value Date/Time   NA 140 02/14/2022 1617   K 3.9 02/14/2022 1617   CL 103 02/14/2022 1617   CO2 30 02/14/2022 1617   BUN 9 02/14/2022 1617   CREATININE 0.98 02/14/2022 1617  Component Value Date/Time   CALCIUM 9.5 02/14/2022 1617   ALKPHOS 60 02/14/2022 1617   AST 23 02/14/2022 1617   ALT 25 02/14/2022 1617   BILITOT 0.9 02/14/2022 1617     Colonoscopy 04/24/2020 - Five 1 to 8 mm polyps in the sigmoid colon and in the descending colon, removed with a cold snare. ADENOMASResected and retrieved. - Perianal rash found on perianal exam. eczema I suspect (he has hx) - The examination was otherwise normal on direct and retroflexion views.   Allergies  Allergen Reactions   Doxycycline     REACTION: rash   Paxil [Paroxetine]     Arthralgia, no sex drive   Current Meds  Medication Sig   Ascorbic Acid (VITAMIN C PO) Take by mouth.   Cholecalciferol 1000 UNITS capsule Take 1,000 Units by mouth  daily.   ciclopirox (PENLAC) 8 % solution Apply topically at bedtime. Apply over nail and surrounding skin daily   clonazePAM (KLONOPIN) 0.5 MG tablet TAKE 1 TABLET(0.5 MG) BY MOUTH TWICE DAILY AS NEEDED FOR ANXIETY OR SPASMS   clotrimazole-betamethasone (LOTRISONE) cream APPLY TO THE AFFECTED AREA TWICE DAILY . USE FOR 2 WEEKS THEN SWITCH TO KETOCONAZOLE CREAM   Cyanocobalamin (VITAMIN B12 PO) Take by mouth.   diclofenac sodium (VOLTAREN) 1 % GEL Apply 1 application topically 4 (four) times daily.   dicyclomine (BENTYL) 20 MG tablet Take 1 tablet (20 mg total) by mouth every 6 (six) hours as needed for spasms.   diphenoxylate-atropine (LOMOTIL) 2.5-0.025 MG tablet Take 1-2 tablets by mouth 4 (four) times daily as needed for diarrhea or loose stools.   guaiFENesin (MUCINEX) 600 MG 12 hr tablet Take 2 tablets (1,200 mg total) by mouth 2 (two) times daily as needed.   HYDROcodone-acetaminophen (NORCO/VICODIN) 5-325 MG tablet Take 1 tablet by mouth 2 (two) times daily as needed for severe pain.   nitroGLYCERIN (NITRO-DUR) 0.1 mg/hr patch 1/4 patch daily   Omega-3 Fatty Acids (FISH OIL PO) Take by mouth.   omeprazole (PRILOSEC) 20 MG capsule Take 1 capsule (20 mg total) by mouth daily.   ondansetron (ZOFRAN-ODT) 4 MG disintegrating tablet Take 1 tablet (4 mg total) by mouth every 8 (eight) hours as needed for nausea or vomiting.   saccharomyces boulardii (FLORASTOR) 250 MG capsule Take 1 capsule (250 mg total) by mouth 2 (two) times daily.   sildenafil (VIAGRA) 100 MG tablet TAKE ONE TABLET BY MOUTH ONE TIME DAILY AS NEEDED FOR ED. MAX '100MG'$  (1 TABLET) PER 24 HOURS   telmisartan (MICARDIS) 80 MG tablet Take 1 tablet (80 mg total) by mouth daily.   triamcinolone ointment (KENALOG) 0.5 % Apply 1 application topically 2 (two) times daily.   zolpidem (AMBIEN) 10 MG tablet TAKE 1 TABLET(10 MG) BY MOUTH AT BEDTIME AS NEEDED FOR SLEEP   Past Medical History:  Diagnosis Date   Allergic rhinitis    Allergy     CELLULITIS, ARM 04/30/2009   Qualifier: Diagnosis of  By: Doralee Albino     Eczema    ED (erectile dysfunction)    Elevated glucose    Fatty liver    GERD (gastroesophageal reflux disease)    HTN (hypertension)    Hyperlipidemia    Internal hemorrhoids with bleeding, Grade 2 prolapse 05/06/2013       Personal history of colonic adenomas 04/01/2013   Pneumothorax    Snoring    ? poss OSA   Past Surgical History:  Procedure Laterality Date   COLONOSCOPY  2015  [place] EXTRACTION  1990   Social History   Social History Narrative   Regular Exercise- yes   family history includes Breast cancer in his mother and sister; CAD in his father; Diabetes in his father; Heart disease in his father.   Review of Systems As per HPI  Objective:   Physical Exam '@BP'$  132/76   Pulse 94   Ht 6' (1.829 m)   Wt 182 lb 6.4 oz (82.7 kg)   SpO2 99%   BMI 24.74 kg/m @  General:  NAD Eyes:   anicteric Lungs:  clear Heart::  S1S2 no rubs, murmurs or gallops Abdomen:  soft and mildly tender periumbilical , BS+ no mass/hernia Ext:   no edema, cyanosis or clubbing ridges fingernails - onychomycosis    Data Reviewed:  See HPI + reviewed 2023 PCP notes + 3/23 ED visit

## 2022-05-11 NOTE — Patient Instructions (Signed)
Your provider has requested that you go to the basement level for lab work before leaving today. Press "B" on the elevator. The lab is located at the first door on the left as you exit the elevator.  Due to recent changes in healthcare laws, you may see the results of your imaging and laboratory studies on MyChart before your provider has had a chance to review them.  We understand that in some cases there may be results that are confusing or concerning to you. Not all laboratory results come back in the same time frame and the provider may be waiting for multiple results in order to interpret others.  Please give [place] 48 hours in order for your provider to thoroughly review all the results before contacting the office for clarification of your results.   We have sent the medications to your pharmacy for you to pick up at your convenience.  You have been scheduled for an endoscopy. Please follow written instructions given to you at your visit today. If you use inhalers (even only as needed), please bring them with you on the day of your procedure.  Follow up with Dr Alain Marion about your anxiety and depression.  I appreciate the opportunity to care for you. Silvano Rusk, MD, [facility]

## 2022-05-12 LAB — IGA: Immunoglobulin A: 185 mg/dL (ref 70–320)

## 2022-05-12 LAB — TISSUE TRANSGLUTAMINASE, IGA: (tTG) Ab, IgA: <1 U/mL

## 2022-05-25 ENCOUNTER — Ambulatory Visit (AMBULATORY_SURGERY_CENTER): Payer: BC Managed Care – PPO | Admitting: Internal Medicine

## 2022-05-25 ENCOUNTER — Encounter: Payer: Self-pay | Admitting: Internal Medicine

## 2022-05-25 VITALS — BP 172/92 | HR 76 | Temp 98.7°F | Resp 13 | Ht 72.0 in | Wt 182.0 lb

## 2022-05-25 DIAGNOSIS — K219 Gastro-esophageal reflux disease without esophagitis: Secondary | ICD-10-CM | POA: Diagnosis not present

## 2022-05-25 DIAGNOSIS — R112 Nausea with vomiting, unspecified: Secondary | ICD-10-CM | POA: Diagnosis not present

## 2022-05-25 DIAGNOSIS — R1033 Periumbilical pain: Secondary | ICD-10-CM | POA: Diagnosis not present

## 2022-05-25 DIAGNOSIS — R09A2 Foreign body sensation, throat: Secondary | ICD-10-CM | POA: Diagnosis not present

## 2022-05-25 DIAGNOSIS — K317 Polyp of stomach and duodenum: Secondary | ICD-10-CM

## 2022-05-25 MED ORDER — SODIUM CHLORIDE 0.9 % IV SOLN: 500.0000 mL | Freq: Once | INTRAVENOUS | Status: DC

## 2022-05-25 NOTE — Progress Notes (Signed)
Pt's states no medical or surgical changes since previsit or office visit. VS assessed by E.C 

## 2022-05-25 NOTE — Progress Notes (Unsigned)
History and Physical Interval Note:  05/25/2022 3:29 PM  Dale Campbell  has presented today for endoscopic procedure(s), with the diagnosis of  Encounter Diagnoses  Name Primary?   Gastroesophageal reflux disease without esophagitis Yes   Nausea and vomiting, unspecified vomiting type    Globus sensation    Periumbilical abdominal pain   .  The various methods of evaluation and treatment have been discussed with the patient and/or family. After consideration of risks, benefits and other options for treatment, the patient has consented to  the endoscopic procedure(s).   The patient's history has been reviewed, patient examined, no change in status, stable for endoscopic procedure(s).  I have reviewed the patient's chart and labs.  Questions were answered to the patient's satisfaction.     Gatha Mayer, MD, [facility]

## 2022-05-25 NOTE — Patient Instructions (Addendum)
There were some tiny nodules or polyps in the stomach which I biopsied. I do not think they represent a significant issue but the biopsies will tell us more.  I will contact with results and recommendations.  I still recommend that you make an appointment to see Dr. Alain Marion and discuss treatment options for anxiety.  I appreciate the opportunity to care for you. Gatha Mayer, MD, [facility]   Await biopsy results of gastric polyps   Make appointment to see Dr Alain Marion    YOU HAD AN ENDOSCOPIC PROCEDURE TODAY AT THE [facility]:   Refer to the procedure report that was given to you for any specific questions about what was found during the examination.  If the procedure report does not answer your questions, please call your gastroenterologist to clarify.  If you requested that your care partner not be given the details of your procedure findings, then the procedure report has been included in a sealed envelope for you to review at your convenience later.  YOU SHOULD EXPECT: Some feelings of bloating in the abdomen. Passage of more gas than usual.  Walking can help get rid of the air that was put into your GI tract during the procedure and reduce the bloating. If you had a lower endoscopy (such as a colonoscopy or flexible sigmoidoscopy) you may notice spotting of blood in your stool or on the toilet paper. If you underwent a bowel prep for your procedure, you may not have a normal bowel movement for a few days.  Please Note:  You might notice some irritation and congestion in your nose or some drainage.  This is from the oxygen used during your procedure.  There is no need for concern and it should clear up in a day or so.  SYMPTOMS TO REPORT IMMEDIATELY:  Following lower endoscopy (colonoscopy or flexible sigmoidoscopy):  Excessive amounts of blood in the stool  Significant tenderness or worsening of abdominal pains  Swelling of the abdomen that is new, acute  Fever of 100F  or higher  Following upper endoscopy (EGD)  Vomiting of blood or coffee ground material  New chest pain or pain under the shoulder blades  Painful or persistently difficult swallowing  New shortness of breath  Fever of 100F or higher  Black, tarry-looking stools  For urgent or emergent issues, a gastroenterologist can be reached at any hour by calling [phone]. Do not use MyChart messaging for urgent concerns.    DIET:  We do recommend a small meal at first, but then you may proceed to your regular diet.  Drink plenty of fluids but you should avoid alcoholic beverages for 24 hours.  ACTIVITY:  You should plan to take it easy for the rest of today and you should NOT DRIVE or use heavy machinery until tomorrow (because of the sedation medicines used during the test).    FOLLOW UP: Our staff will call the number listed on your records the next business day following your procedure.  We will call around 7:15- 8:00 am to check on you and address any questions or concerns that you may have regarding the information given to you following your procedure. If we do not reach you, we will leave a message.     If any biopsies were taken you will be contacted by phone or by letter within the next 1-3 weeks.  Please call us at [phone] if you have not heard about the biopsies in 3 weeks.  SIGNATURES/CONFIDENTIALITY: You and/or your care partner have signed paperwork which will be entered into your electronic medical record.  These signatures attest to the fact that that the information above on your After Visit Summary has been reviewed and is understood.  Full responsibility of the confidentiality of this discharge information lies with you and/or your care-partner.

## 2022-05-25 NOTE — Progress Notes (Unsigned)
Uneventful anesthetic. Report to pacu rn. Vss. Care resumed by rn. 

## 2022-05-25 NOTE — Progress Notes (Unsigned)
Called to room to assist during endoscopic procedure.  Patient ID and intended procedure confirmed with present staff. Received instructions for my participation in the procedure from the performing physician.  

## 2022-05-25 NOTE — Op Note (Addendum)
[facility] Patient Name: Dale Campbell Procedure Date: 05/25/2022 3:29 PM MRN: [id number] Endoscopist: Gatha Mayer , MD, [id number] Age: 62 Referring MD:  Date of Birth: [birth year] Gender: Male Account #: [id number] Procedure:                Upper GI endoscopy Indications:              Periumbilical abdominal pain, Globus sensation,                            Nausea with vomiting Medicines:                Monitored Anesthesia Care Procedure:                Pre-Anesthesia Assessment:                           - Prior to the procedure, a History and Physical                            was performed, and patient medications and                            allergies were reviewed. The patient's tolerance of                            previous anesthesia was also reviewed. The risks                            and benefits of the procedure and the sedation                            options and risks were discussed with the patient.                            All questions were answered, and informed consent                            was obtained. Prior Anticoagulants: The patient has                            taken no anticoagulant or antiplatelet agents. ASA                            Grade Assessment: II - A patient with mild systemic                            disease. After reviewing the risks and benefits,                            the patient was deemed in satisfactory condition to                            undergo the procedure.  After obtaining informed consent, the endoscope was                            passed under direct vision. Throughout the                            procedure, the patient's blood pressure, pulse, and                            oxygen saturations were monitored continuously. The                            Olympus Scope [id number] was introduced through the                            mouth, and advanced to the second  part of duodenum.                            The upper GI endoscopy was accomplished without                            difficulty. The patient tolerated the procedure                            well. Scope In: Scope Out: Findings:                 The examined esophagus was normal.                           Multiple diminutive sessile polyps were found in                            the gastric body. Biopsies were taken with a cold                            forceps for histology. Verification of patient                            identification for the specimen was done. Estimated                            blood loss was minimal.                           The exam was otherwise without abnormality.                           The cardia and gastric fundus were normal on                            retroflexion. Complications:            No immediate complications. Estimated Blood Loss:     Estimated blood loss was minimal. Impression:               -  Normal esophagus.                           - Multiple gastric polyps. Biopsied.                           - The examination was otherwise normal. Recommendation:           - Patient has a contact number available for                            emergencies. The signs and symptoms of potential                            delayed complications were discussed with the                            patient. Return to normal activities tomorrow.                            Written discharge instructions were provided to the                            patient.                           - Resume previous diet.                           - Continue present medications.                           - Await pathology results.                           - Patienbt to make appointment to see Dr. Alain Marion                            re: anxiety which I velieve is contributing to GI                            sxs - he does say using Athelet[employer]                             supplements seem to be helping mood Gatha Mayer, MD 05/25/2022 3:47:45 PM This report has been signed electronically.

## 2022-05-26 ENCOUNTER — Telehealth: Payer: Self-pay

## 2022-05-26 NOTE — Telephone Encounter (Signed)
  Follow up Call-     05/25/2022    3:01 PM 04/24/2020    9:31 AM  Call back number  Post procedure Call Back phone  # [phone]  Permission to leave phone message Yes Yes     Patient questions:  Do you have a fever, pain , or abdominal swelling? No. Pain Score  0 *  Have you tolerated food without any problems? Yes.    Have you been able to return to your normal activities? Yes.    Do you have any questions about your discharge instructions: Diet   No. Medications  No. Follow up visit  No.  Do you have questions or concerns about your Care? No.  Actions: * If pain score is 4 or above: No action needed, pain <4.

## 2022-06-02 ENCOUNTER — Encounter: Payer: Self-pay | Admitting: Internal Medicine

## 2022-06-03 ENCOUNTER — Other Ambulatory Visit: Payer: Self-pay | Admitting: Internal Medicine

## 2022-06-03 ENCOUNTER — Encounter: Payer: Self-pay | Admitting: Internal Medicine

## 2022-06-03 MED ORDER — HYDROCODONE-ACETAMINOPHEN 5-325 MG PO TABS: 1.0000 | ORAL_TABLET | Freq: Two times a day (BID) | ORAL | 0 refills | Status: DC | PRN

## 2022-06-04 ENCOUNTER — Encounter: Payer: Self-pay | Admitting: Internal Medicine

## 2022-07-11 ENCOUNTER — Ambulatory Visit: Payer: BC Managed Care – PPO | Admitting: Internal Medicine

## 2022-07-11 ENCOUNTER — Encounter: Payer: Self-pay | Admitting: Internal Medicine

## 2022-07-11 VITALS — BP 130/78 | HR 80 | Temp 98.3°F | Ht 72.0 in | Wt 185.0 lb

## 2022-07-11 DIAGNOSIS — R944 Abnormal results of kidney function studies: Secondary | ICD-10-CM | POA: Diagnosis not present

## 2022-07-11 DIAGNOSIS — E785 Hyperlipidemia, unspecified: Secondary | ICD-10-CM

## 2022-07-11 DIAGNOSIS — M19049 Primary osteoarthritis, unspecified hand: Secondary | ICD-10-CM | POA: Insufficient documentation

## 2022-07-11 DIAGNOSIS — M542 Cervicalgia: Secondary | ICD-10-CM

## 2022-07-11 DIAGNOSIS — F419 Anxiety disorder, unspecified: Secondary | ICD-10-CM

## 2022-07-11 MED ORDER — CLONAZEPAM 0.5 MG PO TABS: ORAL_TABLET | ORAL | 1 refills | Status: DC

## 2022-07-11 MED ORDER — HYDROCODONE-ACETAMINOPHEN 5-325 MG PO TABS: 1.0000 | ORAL_TABLET | Freq: Two times a day (BID) | ORAL | 0 refills | Status: DC | PRN

## 2022-07-11 MED ORDER — ZOLPIDEM TARTRATE 10 MG PO TABS: ORAL_TABLET | ORAL | 3 refills | Status: DC

## 2022-07-11 NOTE — Assessment & Plan Note (Signed)
Musculoskeletal pain  Norco prn  Potential benefits of a long term opioids use as well as potential risks (i.e. addiction risk, apnea etc) and complications (i.e. Somnolence, constipation and others) were explained to the patient and were aknowledged.

## 2022-07-11 NOTE — Assessment & Plan Note (Addendum)
Worse.  Aleve as needed Blue-Emu cream was recommended to use 2-3 times a day

## 2022-07-11 NOTE — Assessment & Plan Note (Signed)
Hydrate well ?Monitor GFR ?

## 2022-07-11 NOTE — Patient Instructions (Signed)
USEFUL THINGS FOR HAND ARTHRITIS  RICE SOCK HEATING PAD: [email or web address]://www.instructables.com/Homemade-Heating-Pad/    SILICONE PADS:    BRIX JAR OPENER:    NITRILE COATED GARDEN GLOVES:   THUMB BRACE:     BLUE EMU CREAM:    

## 2022-07-11 NOTE — Assessment & Plan Note (Signed)
ASA A cardiac CT scan for calcium scoring offered previously Smoker

## 2022-07-11 NOTE — Progress Notes (Signed)
Subjective:  Patient ID: Dale Campbell, male    DOB: 27-May-1960  Age: 62 y.o. MRN: [id number]  CC: Follow-up (3 mnth f/u, Soreness in joints and aches)   HPI Dale Campbell presents for insomnia, LBP C/o achy fingers, hands F/u insomnia, anxiety  Outpatient Medications Prior to Visit  Medication Sig Dispense Refill   Ascorbic Acid (VITAMIN C PO) Take by mouth.     Cholecalciferol 1000 UNITS capsule Take 1,000 Units by mouth daily.     omeprazole (PRILOSEC) 20 MG capsule Take 1 capsule (20 mg total) by mouth daily. 90 capsule 3   telmisartan (MICARDIS) 80 MG tablet Take 80 mg by mouth daily.     clonazePAM (KLONOPIN) 0.5 MG tablet TAKE 1 TABLET(0.5 MG) BY MOUTH TWICE DAILY AS NEEDED FOR ANXIETY OR SPASMS 60 tablet 1   HYDROcodone-acetaminophen (NORCO/VICODIN) 5-325 MG tablet Take 1 tablet by mouth 2 (two) times daily as needed for severe pain. 60 tablet 0   zolpidem (AMBIEN) 10 MG tablet TAKE 1 TABLET(10 MG) BY MOUTH AT BEDTIME AS NEEDED FOR SLEEP 30 tablet 3   ciclopirox (PENLAC) 8 % solution Apply topically at bedtime. Apply over nail and surrounding skin daily (Patient not taking: Reported on 05/11/2022) 6.6 mL 1   Cyanocobalamin (VITAMIN B12 PO) Take by mouth. (Patient not taking: Reported on 05/25/2022)     diclofenac sodium (VOLTAREN) 1 % GEL Apply 1 application topically 4 (four) times daily. (Patient not taking: Reported on 05/11/2022) 100 g 3   dicyclomine (BENTYL) 20 MG tablet Take 1 tablet (20 mg total) by mouth 3 (three) times daily before meals. (Patient not taking: Reported on 05/25/2022) 90 tablet 0   ondansetron (ZOFRAN-ODT) 4 MG disintegrating tablet Take 1 tablet (4 mg total) by mouth every 8 (eight) hours as needed for nausea or vomiting. (Patient not taking: Reported on 05/25/2022) 20 tablet 0   promethazine (PHENERGAN) 25 MG tablet Take 1 tablet (25 mg total) by mouth every 6 (six) hours as needed for nausea or vomiting. (Patient not taking: Reported on 05/25/2022) 30  tablet 0   telmisartan (MICARDIS) 80 MG tablet Take 1 tablet (80 mg total) by mouth daily. (Patient not taking: Reported on 05/25/2022) 90 tablet 3   No facility-administered medications prior to visit.    ROS: Review of Systems  Constitutional:  Negative for appetite change, fatigue and unexpected weight change.  HENT:  Negative for congestion, nosebleeds, sneezing, sore throat and trouble swallowing.   Eyes:  Negative for itching and visual disturbance.  Respiratory:  Negative for cough.   Cardiovascular:  Negative for chest pain, palpitations and leg swelling.  Gastrointestinal:  Negative for abdominal distention, blood in stool, diarrhea and nausea.  Genitourinary:  Negative for frequency and hematuria.  Musculoskeletal:  Positive for arthralgias. Negative for back pain, gait problem, joint swelling and neck pain.  Skin:  Negative for rash.  Neurological:  Negative for dizziness, tremors, speech difficulty and weakness.  Psychiatric/Behavioral:  Negative for agitation, dysphoric mood and sleep disturbance. The patient is not nervous/anxious.     Objective:  BP 130/78 (BP Location: Right Arm, Patient Position: Sitting, Cuff Size: Large)   Pulse 80   Temp 98.3 F (36.8 C) (Oral)   Ht 6' (1.829 m)   Wt 185 lb (83.9 kg)   SpO2 96%   BMI 25.09 kg/m   BP Readings from Last 3 Encounters:  07/11/22 130/78  05/25/22 (!) 172/92  05/11/22 132/76    Wt Readings from Last 3  Encounters:  07/11/22 185 lb (83.9 kg)  05/25/22 182 lb (82.6 kg)  05/11/22 182 lb 6.4 oz (82.7 kg)    Physical Exam Constitutional:      General: He is not in acute distress.    Appearance: He is well-developed.     Comments: NAD  Eyes:     Conjunctiva/sclera: Conjunctivae normal.     Pupils: Pupils are equal, round, and reactive to light.  Neck:     Thyroid: No thyromegaly.     Vascular: No JVD.  Cardiovascular:     Rate and Rhythm: Normal rate and regular rhythm.     Heart sounds: Normal heart  sounds. No murmur heard.    No friction rub. No gallop.  Pulmonary:     Effort: Pulmonary effort is normal. No respiratory distress.     Breath sounds: Normal breath sounds. No wheezing or rales.  Chest:     Chest wall: No tenderness.  Abdominal:     General: Bowel sounds are normal. There is no distension.     Palpations: Abdomen is soft. There is no mass.     Tenderness: There is no abdominal tenderness. There is no guarding or rebound.  Musculoskeletal:        General: No tenderness. Normal range of motion.     Cervical back: Normal range of motion.  Lymphadenopathy:     Cervical: No cervical adenopathy.  Skin:    General: Skin is warm and dry.     Findings: No rash.  Neurological:     Mental Status: He is alert and oriented to person, place, and time.     Cranial Nerves: No cranial nerve deficit.     Motor: No abnormal muscle tone.     Coordination: Coordination normal.     Gait: Gait normal.     Deep Tendon Reflexes: Reflexes are normal and symmetric.  Psychiatric:        Behavior: Behavior normal.        Thought Content: Thought content normal.        Judgment: Judgment normal.   Thumb joints with pain Cervical spine is stiff  Lab Results  Component Value Date   WBC 5.8 02/14/2022   HGB 15.9 02/14/2022   HCT 45.6 02/14/2022   PLT 161.0 02/14/2022   GLUCOSE 91 02/14/2022   CHOL 188 02/14/2022   TRIG 96.0 02/14/2022   HDL 42.10 02/14/2022   LDLDIRECT 159.0 02/11/2021   LDLCALC 127 (H) 02/14/2022   ALT 25 02/14/2022   AST 23 02/14/2022   NA 140 02/14/2022   K 3.9 02/14/2022   CL 103 02/14/2022   CREATININE 0.98 02/14/2022   BUN 9 02/14/2022   CO2 30 02/14/2022   TSH 1.93 02/14/2022   PSA 0.27 02/11/2021   HGBA1C 5.1 05/12/2009    No results found.  Assessment & Plan:   Problem List Items Addressed This Visit     Neck pain - Primary    Musculoskeletal pain  Norco prn  Potential benefits of a long term opioids use as well as potential risks (i.e.  addiction risk, apnea etc) and complications (i.e. Somnolence, constipation and others) were explained to the patient and were aknowledged.      Dyslipidemia    ASA A cardiac CT scan for calcium scoring offered previously Smoker      Anxiety    Chronic Clonazepam prn rare. Not to take w/Zolpidem  Potential benefits of a long term benzodiazepines  use as well as potential risks  and complications were explained to the patient and were aknowledged.      Decreased GFR    Hydrate well Monitor GFR      Hand arthritis    Worse.  Aleve as needed Blue-Emu cream was recommended to use 2-3 times a day       Relevant Medications   HYDROcodone-acetaminophen (NORCO/VICODIN) 5-325 MG tablet      Meds ordered this encounter  Medications   clonazePAM (KLONOPIN) 0.5 MG tablet    Sig: TAKE 1 TABLET(0.5 MG) BY MOUTH TWICE DAILY AS NEEDED FOR ANXIETY OR SPASMS    Dispense:  60 tablet    Refill:  1   HYDROcodone-acetaminophen (NORCO/VICODIN) 5-325 MG tablet    Sig: Take 1 tablet by mouth 2 (two) times daily as needed for severe pain.    Dispense:  60 tablet    Refill:  0   zolpidem (AMBIEN) 10 MG tablet    Sig: TAKE 1 TABLET(10 MG) BY MOUTH AT BEDTIME AS NEEDED FOR SLEEP    Dispense:  30 tablet    Refill:  3      Follow-up: Return in about 3 months (around 10/10/2022) for a follow-up visit.  Sonda Primes, MD

## 2022-07-11 NOTE — Assessment & Plan Note (Signed)
Chronic Clonazepam prn rare. Not to take w/Zolpidem  Potential benefits of a long term benzodiazepines  use as well as potential risks  and complications were explained to the patient and were aknowledged.

## 2022-08-23 ENCOUNTER — Other Ambulatory Visit: Payer: Self-pay | Admitting: Internal Medicine

## 2022-08-25 MED ORDER — HYDROCODONE-ACETAMINOPHEN 5-325 MG PO TABS: 1.0000 | ORAL_TABLET | Freq: Two times a day (BID) | ORAL | 0 refills | Status: DC | PRN

## 2022-10-02 ENCOUNTER — Other Ambulatory Visit: Payer: Self-pay | Admitting: Internal Medicine

## 2022-10-05 MED ORDER — HYDROCODONE-ACETAMINOPHEN 5-325 MG PO TABS: 1.0000 | ORAL_TABLET | Freq: Two times a day (BID) | ORAL | 0 refills | Status: DC | PRN

## 2022-11-08 ENCOUNTER — Other Ambulatory Visit: Payer: Self-pay | Admitting: Internal Medicine

## 2022-11-09 NOTE — Telephone Encounter (Signed)
Schedule office visit

## 2022-11-15 ENCOUNTER — Other Ambulatory Visit: Payer: Self-pay | Admitting: Internal Medicine

## 2022-11-17 ENCOUNTER — Other Ambulatory Visit: Payer: Self-pay | Admitting: Internal Medicine

## 2022-11-17 NOTE — Telephone Encounter (Signed)
Please change patient's pharmacy as requested by him.  Second request.

## 2022-11-17 NOTE — Telephone Encounter (Signed)
Change patient's pharmacy as requested please

## 2022-11-18 ENCOUNTER — Other Ambulatory Visit: Payer: Self-pay | Admitting: Radiology

## 2022-11-18 MED ORDER — CLONAZEPAM 0.5 MG PO TABS: ORAL_TABLET | ORAL | 0 refills | Status: DC

## 2022-11-18 NOTE — Telephone Encounter (Signed)
Pharmacy has been changed to [facility] [address] [phone]

## 2022-11-18 NOTE — Telephone Encounter (Signed)
[facility] was called and verified the medication has not yet been filled by patient. I then requested that they cancel the medication. They stated medication could be canceled. Prescription then approved and [facility].

## 2022-11-21 ENCOUNTER — Telehealth: Payer: Self-pay | Admitting: Internal Medicine

## 2022-11-21 NOTE — Telephone Encounter (Signed)
Prescription Request  11/21/2022  LOV: 07/11/2022  What is the name of the medication or equipment?  HYDROcodone-acetaminophen (NORCO/VICODIN) 5-325 MG tablet   Have you contacted your pharmacy to request a refill? Yes   Which pharmacy would you like this sent to?  [id number] - [place], [place] - [address] AT [facility] OF [address] [place] [address] Phone: [phone] Fax: [phone]    Patient notified that their request is being sent to the clinical staff for review and that they should receive a response within 2 business days.   Please advise at Mobile [phone] (mobile)

## 2022-11-22 ENCOUNTER — Encounter: Payer: Self-pay | Admitting: Internal Medicine

## 2022-11-22 NOTE — Telephone Encounter (Signed)
Dale Campbell needs an office visit.  Thank you

## 2022-11-22 NOTE — Telephone Encounter (Signed)
I was able to inform the pt that per provider he is needing a OV to get refills.

## 2022-11-25 ENCOUNTER — Other Ambulatory Visit: Payer: Self-pay | Admitting: Internal Medicine

## 2022-11-25 MED ORDER — HYDROCODONE-ACETAMINOPHEN 5-325 MG PO TABS: 1.0000 | ORAL_TABLET | Freq: Two times a day (BID) | ORAL | 0 refills | Status: DC | PRN

## 2022-12-12 ENCOUNTER — Encounter: Payer: Self-pay | Admitting: Internal Medicine

## 2022-12-12 ENCOUNTER — Ambulatory Visit (INDEPENDENT_AMBULATORY_CARE_PROVIDER_SITE_OTHER): Payer: 59 | Admitting: Internal Medicine

## 2022-12-12 VITALS — BP 134/90 | HR 68 | Temp 98.3°F | Ht 72.0 in | Wt 187.2 lb

## 2022-12-12 DIAGNOSIS — I10 Essential (primary) hypertension: Secondary | ICD-10-CM

## 2022-12-12 DIAGNOSIS — Z23 Encounter for immunization: Secondary | ICD-10-CM | POA: Diagnosis not present

## 2022-12-12 DIAGNOSIS — Z0000 Encounter for general adult medical examination without abnormal findings: Secondary | ICD-10-CM

## 2022-12-12 DIAGNOSIS — R944 Abnormal results of kidney function studies: Secondary | ICD-10-CM

## 2022-12-12 DIAGNOSIS — F419 Anxiety disorder, unspecified: Secondary | ICD-10-CM

## 2022-12-12 DIAGNOSIS — E785 Hyperlipidemia, unspecified: Secondary | ICD-10-CM

## 2022-12-12 MED ORDER — CLONAZEPAM 0.5 MG PO TABS: ORAL_TABLET | ORAL | 5 refills | Status: DC

## 2022-12-12 MED ORDER — ZOLPIDEM TARTRATE 10 MG PO TABS: ORAL_TABLET | ORAL | 5 refills | Status: DC

## 2022-12-12 MED ORDER — TELMISARTAN 80 MG PO TABS: 80.0000 mg | ORAL_TABLET | Freq: Every day | ORAL | 3 refills | Status: DC

## 2022-12-12 MED ORDER — HYDROCODONE-ACETAMINOPHEN 5-325 MG PO TABS: 1.0000 | ORAL_TABLET | Freq: Two times a day (BID) | ORAL | 0 refills | Status: DC | PRN

## 2022-12-12 MED ORDER — OMEPRAZOLE 20 MG PO CPDR: 20.0000 mg | DELAYED_RELEASE_CAPSULE | Freq: Every day | ORAL | 3 refills | Status: DC

## 2022-12-12 NOTE — Assessment & Plan Note (Signed)
Hydrate well ?Monitor GFR ?

## 2022-12-12 NOTE — Progress Notes (Signed)
Subjective:  Patient ID: Dale Campbell, male    DOB: 1960/11/07  Age: 62 y.o. MRN: [id number]  CC: Medical Management of Chronic Issues (Pain medication follow up )   HPI Dale Campbell presents for LBP, anxiety, insomnia. [name] changed jobs. 2 aunts are in Hospice... M 86, F 93  Outpatient Medications Prior to Visit  Medication Sig Dispense Refill   Ascorbic Acid (VITAMIN C PO) Take by mouth.     Cholecalciferol 1000 UNITS capsule Take 1,000 Units by mouth daily.     clonazePAM (KLONOPIN) 0.5 MG tablet TAKE 1 TABLET(0.5 MG) BY MOUTH TWICE DAILY AS NEEDED FOR ANXIETY OR SPASMS 60 tablet 0   HYDROcodone-acetaminophen (NORCO/VICODIN) 5-325 MG tablet Take 1 tablet by mouth 2 (two) times daily as needed for severe pain. 30 tablet 0   omeprazole (PRILOSEC) 20 MG capsule Take 1 capsule (20 mg total) by mouth daily. 90 capsule 3   telmisartan (MICARDIS) 80 MG tablet TAKE 1 TABLET(80 MG) BY MOUTH DAILY 90 tablet 3   zolpidem (AMBIEN) 10 MG tablet TAKE 1 TABLET(10 MG) BY MOUTH AT BEDTIME AS NEEDED FOR SLEEP 30 tablet 3   No facility-administered medications prior to visit.    ROS: Review of Systems  Constitutional:  Positive for fatigue. Negative for appetite change and unexpected weight change.  HENT:  Negative for congestion, nosebleeds, sneezing, sore throat and trouble swallowing.   Eyes:  Negative for itching and visual disturbance.  Respiratory:  Negative for cough.   Cardiovascular:  Negative for chest pain, palpitations and leg swelling.  Gastrointestinal:  Negative for abdominal distention, blood in stool, diarrhea and nausea.  Genitourinary:  Negative for frequency and hematuria.  Musculoskeletal:  Positive for back pain. Negative for gait problem, joint swelling and neck pain.  Skin:  Negative for rash.  Neurological:  Negative for dizziness, tremors, speech difficulty and weakness.  Psychiatric/Behavioral:  Positive for sleep disturbance. Negative for agitation,  dysphoric mood and suicidal ideas. The patient is nervous/anxious.     Objective:  BP (!) 134/90 (BP Location: Left Arm, Patient Position: Sitting, Cuff Size: Normal)   Pulse 68   Temp 98.3 F (36.8 C) (Oral)   Ht 6' (1.829 m)   Wt 187 lb 3.2 oz (84.9 kg)   SpO2 97%   BMI 25.39 kg/m   BP Readings from Last 3 Encounters:  12/12/22 (!) 134/90  07/11/22 130/78  05/25/22 (!) 172/92    Wt Readings from Last 3 Encounters:  12/12/22 187 lb 3.2 oz (84.9 kg)  07/11/22 185 lb (83.9 kg)  05/25/22 182 lb (82.6 kg)    Physical Exam Constitutional:      General: He is not in acute distress.    Appearance: Normal appearance. He is well-developed.     Comments: NAD  Eyes:     Conjunctiva/sclera: Conjunctivae normal.     Pupils: Pupils are equal, round, and reactive to light.  Neck:     Thyroid: No thyromegaly.     Vascular: No JVD.  Cardiovascular:     Rate and Rhythm: Normal rate and regular rhythm.     Heart sounds: Normal heart sounds. No murmur heard.    No friction rub. No gallop.  Pulmonary:     Effort: Pulmonary effort is normal. No respiratory distress.     Breath sounds: Normal breath sounds. No wheezing or rales.  Chest:     Chest wall: No tenderness.  Abdominal:     General: Bowel sounds are normal. There is  no distension.     Palpations: Abdomen is soft. There is no mass.     Tenderness: There is no abdominal tenderness. There is no guarding or rebound.  Musculoskeletal:        General: Tenderness present. Normal range of motion.     Cervical back: Normal range of motion.  Lymphadenopathy:     Cervical: No cervical adenopathy.  Skin:    General: Skin is warm and dry.     Findings: No rash.  Neurological:     Mental Status: He is alert and oriented to person, place, and time.     Cranial Nerves: No cranial nerve deficit.     Motor: No abnormal muscle tone.     Coordination: Coordination normal.     Gait: Gait normal.     Deep Tendon Reflexes: Reflexes are  normal and symmetric.  Psychiatric:        Behavior: Behavior normal.        Thought Content: Thought content normal.        Judgment: Judgment normal.   LS w/pain  Lab Results  Component Value Date   WBC 5.8 02/14/2022   HGB 15.9 02/14/2022   HCT 45.6 02/14/2022   PLT 161.0 02/14/2022   GLUCOSE 91 02/14/2022   CHOL 188 02/14/2022   TRIG 96.0 02/14/2022   HDL 42.10 02/14/2022   LDLDIRECT 159.0 02/11/2021   LDLCALC 127 (H) 02/14/2022   ALT 25 02/14/2022   AST 23 02/14/2022   NA 140 02/14/2022   K 3.9 02/14/2022   CL 103 02/14/2022   CREATININE 0.98 02/14/2022   BUN 9 02/14/2022   CO2 30 02/14/2022   TSH 1.93 02/14/2022   PSA 0.27 02/11/2021   HGBA1C 5.1 05/12/2009    No results found.  Assessment & Plan:   Problem List Items Addressed This Visit     Essential hypertension - Primary    On Micardis and Norvasc      Relevant Medications   telmisartan (MICARDIS) 80 MG tablet   Other Relevant Orders   TSH   Urinalysis   CBC with Differential/Platelet   Lipid panel   PSA   Comprehensive metabolic panel   Well adult exam   Relevant Orders   TSH   Urinalysis   CBC with Differential/Platelet   Lipid panel   PSA   Comprehensive metabolic panel   Dyslipidemia    ASA A cardiac CT scan for calcium scoring offered previously Smoker      Anxiety    Chronic Clonazepam prn rare. Not to take w/Zolpidem  Potential benefits of a long term benzodiazepines  use as well as potential risks  and complications were explained to the patient and were aknowledged.  [name] changed jobs. 2 aunts are in Hospice...      Decreased GFR    Hydrate well Monitor GFR      Other Visit Diagnoses     Need for immunization against influenza       Relevant Orders   Flu vaccine trivalent PF, 6mos and older(Flulaval,Afluria,Fluarix,Fluzone) (Completed)         Meds ordered this encounter  Medications   clonazePAM (KLONOPIN) 0.5 MG tablet    Sig: TAKE 1 TABLET(0.5 MG) BY  MOUTH TWICE DAILY AS NEEDED FOR ANXIETY OR SPASMS    Dispense:  60 tablet    Refill:  5   HYDROcodone-acetaminophen (NORCO/VICODIN) 5-325 MG tablet    Sig: Take 1 tablet by mouth 2 (two) times daily as needed for  severe pain.    Dispense:  60 tablet    Refill:  0    Schedule office visit every 3 months   omeprazole (PRILOSEC) 20 MG capsule    Sig: Take 1 capsule (20 mg total) by mouth daily.    Dispense:  90 capsule    Refill:  3   telmisartan (MICARDIS) 80 MG tablet    Sig: Take 1 tablet (80 mg total) by mouth daily. TAKE 1 TABLET(80 MG) BY MOUTH DAILY    Dispense:  90 tablet    Refill:  3   zolpidem (AMBIEN) 10 MG tablet    Sig: TAKE 1 TABLET(10 MG) BY MOUTH AT BEDTIME AS NEEDED FOR SLEEP    Dispense:  30 tablet    Refill:  5      Follow-up: Return in about 3 months (around 03/13/2023) for Wellness Exam.  Sonda Primes, MD

## 2022-12-12 NOTE — Assessment & Plan Note (Signed)
ASA A cardiac CT scan for calcium scoring offered previously Smoker

## 2022-12-12 NOTE — Assessment & Plan Note (Addendum)
Chronic Clonazepam prn rare. Not to take w/Zolpidem  Potential benefits of a long term benzodiazepines  use as well as potential risks  and complications were explained to the patient and were aknowledged.  Dale Campbell changed jobs. 2 aunts are in Hospice.Marland KitchenMarland Kitchen

## 2022-12-12 NOTE — Assessment & Plan Note (Signed)
On Micardis and Norvasc

## 2022-12-26 IMAGING — DX DG LUMBAR SPINE 2-3V
3 series · 3 of 3 positions shown · non-contrast
Comparison: None.

CLINICAL DATA: Right sciatica

EXAM:
LUMBAR SPINE - 2-3 VIEW

[l-spine ap]
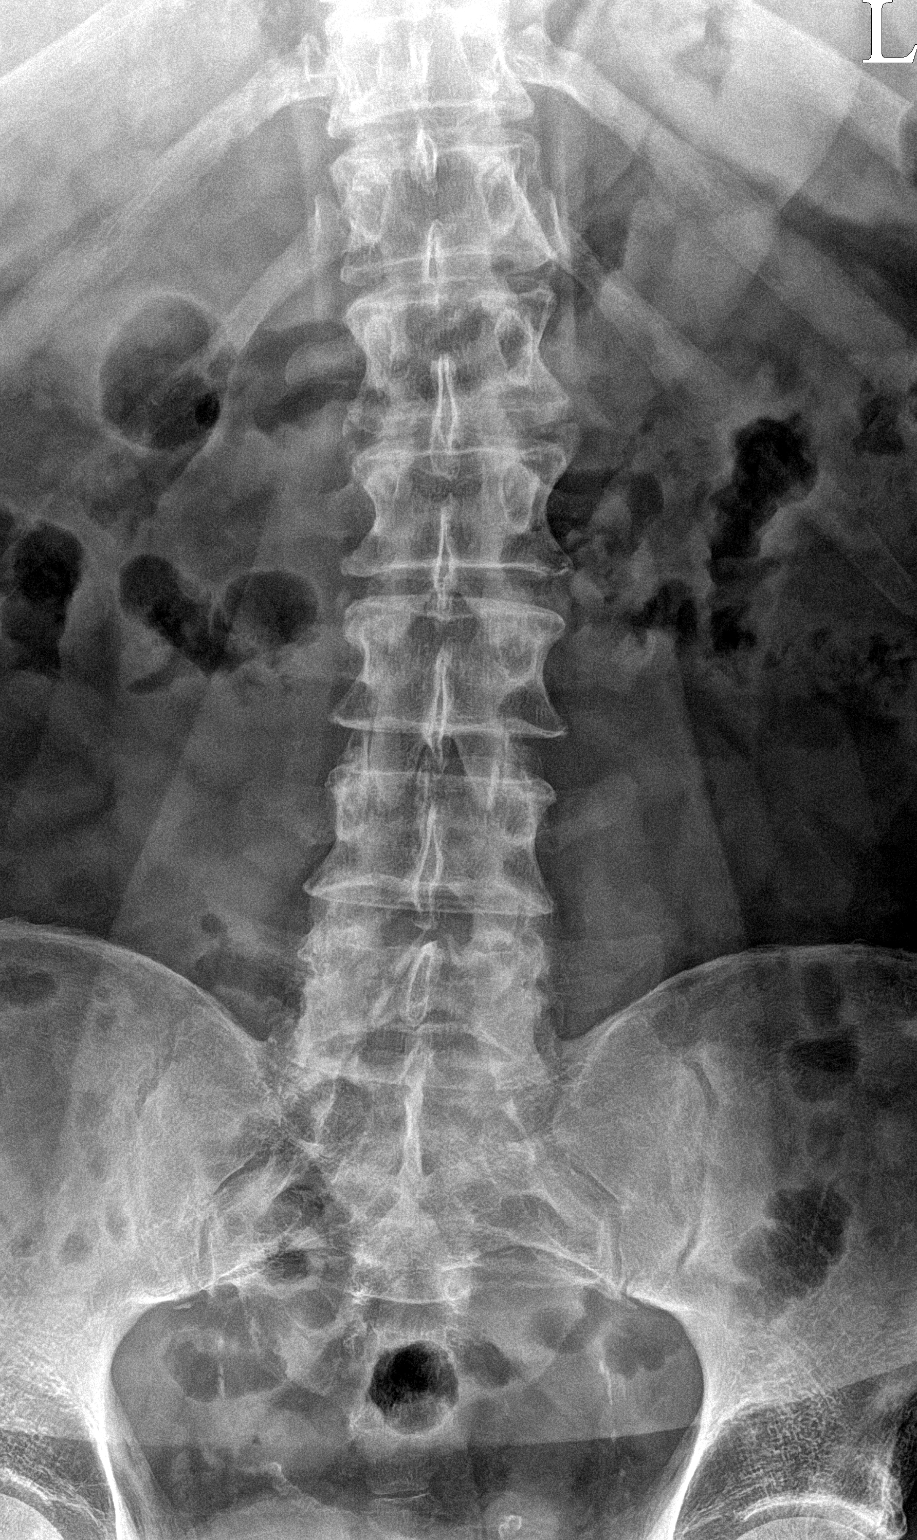

[l-spine lateral]
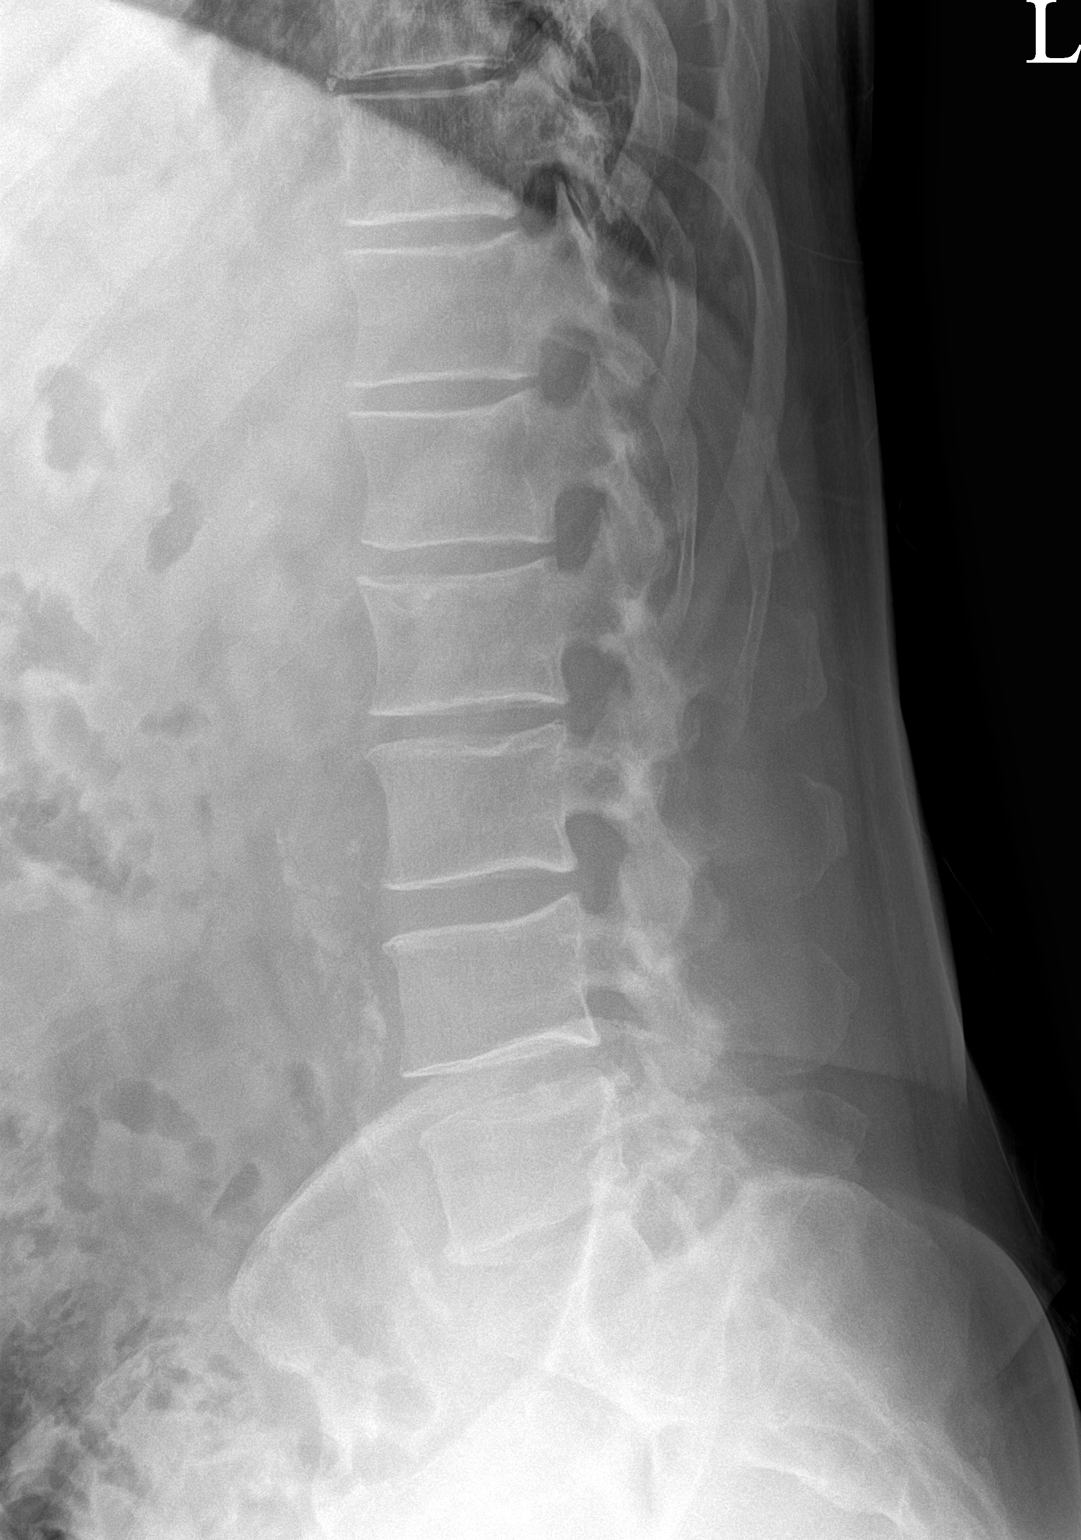

[l-spine spot]
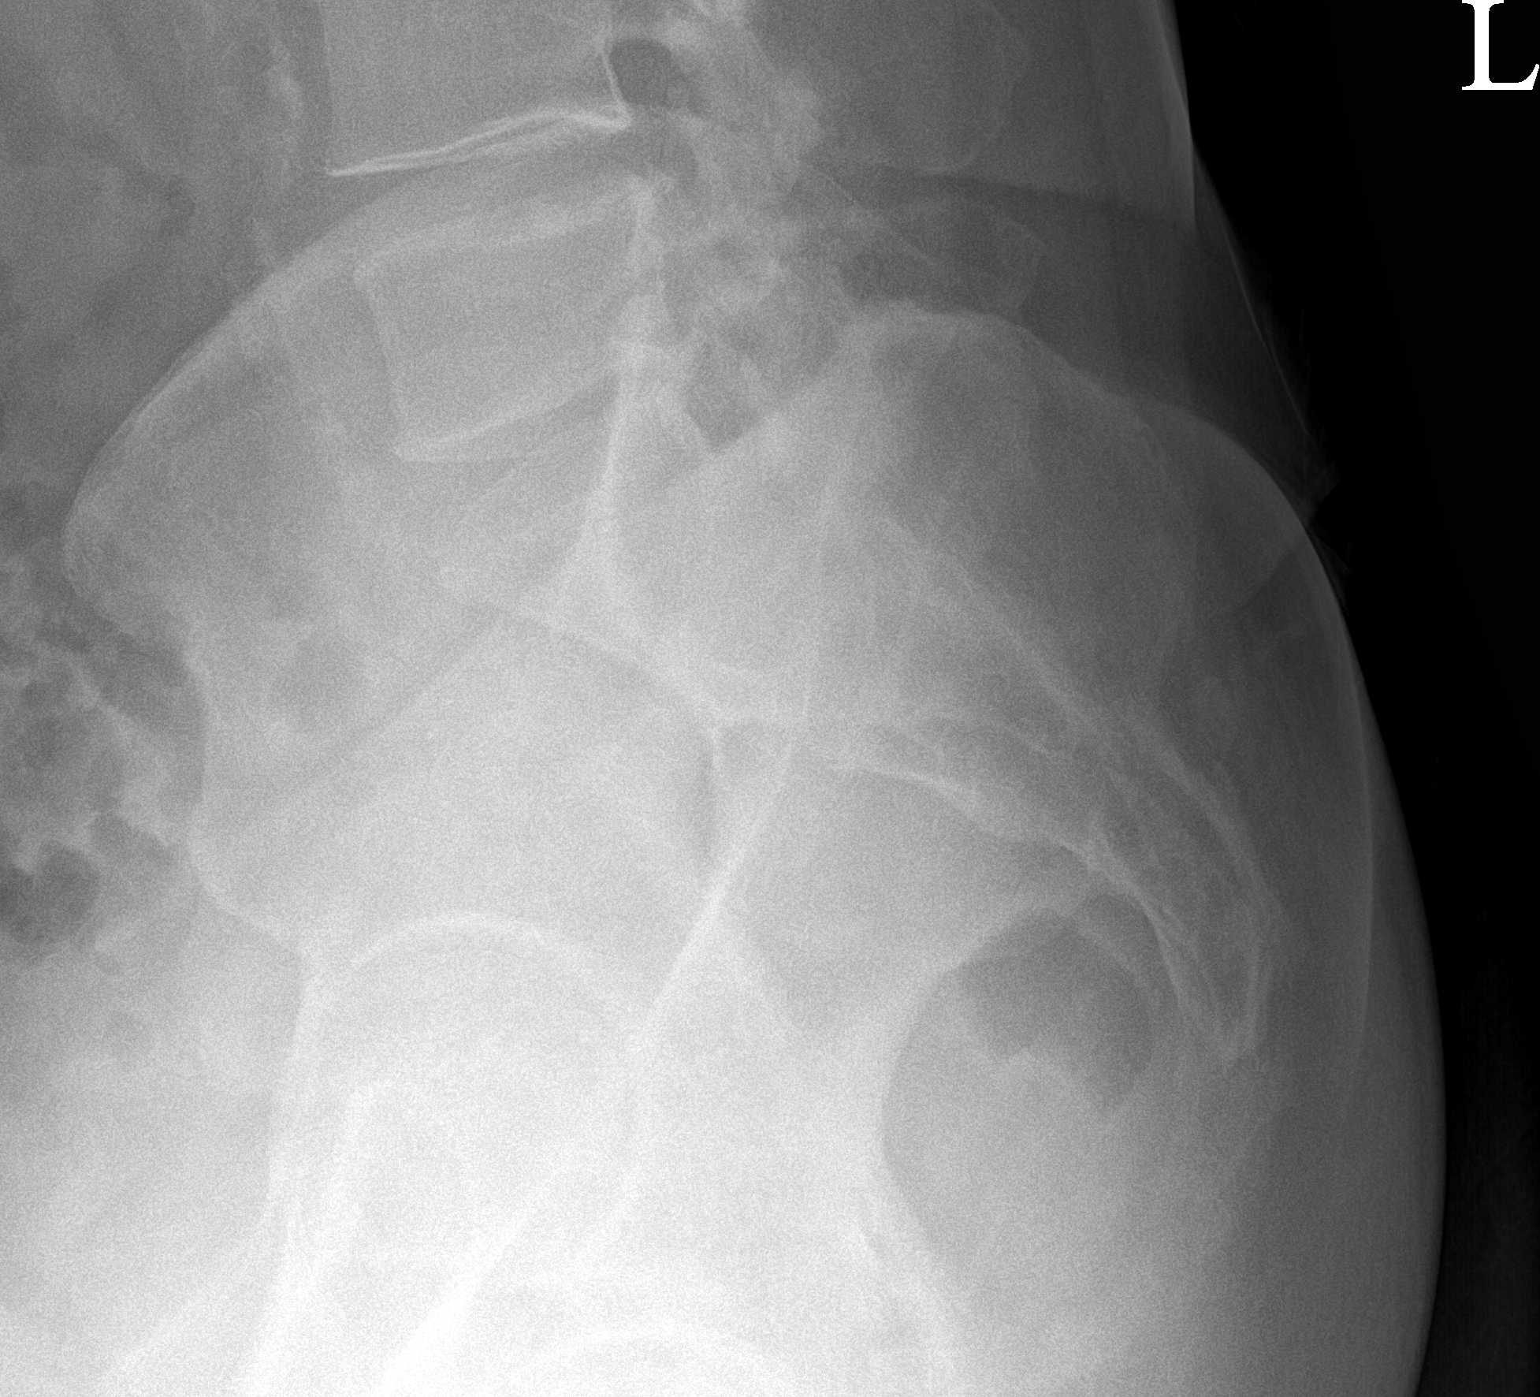

[3 of 3 positions shown; findings below may reference images not displayed]

FINDINGS: Normal alignment. No acute fracture or listhesis. Vertebral body
height is preserved. Minimal intervertebral disc space narrowing is
noted at L2-3 in keeping with changes of minimal degenerative disc
disease. Remaining intervertebral disc heights are preserved.
Vascular calcifications are seen within the abdominal aorta.
Paraspinal soft tissues are otherwise unremarkable.
IMPRESSION: Minimal degenerative disc disease at L2-3.

## 2023-01-11 ENCOUNTER — Other Ambulatory Visit: Payer: Self-pay | Admitting: Internal Medicine

## 2023-01-11 MED ORDER — HYDROCODONE-ACETAMINOPHEN 5-325 MG PO TABS: 1.0000 | ORAL_TABLET | Freq: Two times a day (BID) | ORAL | 0 refills | Status: DC | PRN

## 2023-02-14 ENCOUNTER — Other Ambulatory Visit (INDEPENDENT_AMBULATORY_CARE_PROVIDER_SITE_OTHER): Payer: 59

## 2023-02-14 DIAGNOSIS — Z0000 Encounter for general adult medical examination without abnormal findings: Secondary | ICD-10-CM | POA: Diagnosis not present

## 2023-02-14 DIAGNOSIS — Z125 Encounter for screening for malignant neoplasm of prostate: Secondary | ICD-10-CM

## 2023-02-14 DIAGNOSIS — I10 Essential (primary) hypertension: Secondary | ICD-10-CM

## 2023-02-14 LAB — CBC WITH DIFFERENTIAL/PLATELET
Basophils Absolute: 0 10*3/uL (ref 0.0–0.1)
Basophils Relative: 1 % (ref 0.0–3.0)
Eosinophils Absolute: 0.1 10*3/uL (ref 0.0–0.7)
Eosinophils Relative: 4.1 % (ref 0.0–5.0)
HCT: 43.8 % (ref 39.0–52.0)
Hemoglobin: 15.4 g/dL (ref 13.0–17.0)
Lymphocytes Relative: 41.1 % (ref 12.0–46.0)
Lymphs Abs: 1.4 10*3/uL (ref 0.7–4.0)
MCHC: 35.2 g/dL (ref 30.0–36.0)
MCV: 91 fL (ref 78.0–100.0)
Monocytes Absolute: 0.4 10*3/uL (ref 0.1–1.0)
Monocytes Relative: 10.9 % (ref 3.0–12.0)
Neutro Abs: 1.5 10*3/uL (ref 1.4–7.7)
Neutrophils Relative %: 42.9 % — ABNORMAL LOW (ref 43.0–77.0)
Platelets: 144 10*3/uL — ABNORMAL LOW (ref 150.0–400.0)
RBC: 4.81 Mil/uL (ref 4.22–5.81)
RDW: 12.6 % (ref 11.5–15.5)
WBC: 3.5 10*3/uL — ABNORMAL LOW (ref 4.0–10.5)

## 2023-02-14 LAB — LIPID PANEL
Cholesterol: 174 mg/dL (ref 0–200)
HDL: 31.4 mg/dL — ABNORMAL LOW (ref >39.00)
LDL Cholesterol: 117 mg/dL — ABNORMAL HIGH (ref 0–99)
NonHDL: 142.27
Total CHOL/HDL Ratio: 6
Triglycerides: 124 mg/dL (ref 0.0–149.0)
VLDL: 24.8 mg/dL (ref 0.0–40.0)

## 2023-02-14 LAB — PSA: PSA: 0.22 ng/mL (ref 0.10–4.00)

## 2023-02-14 LAB — URINALYSIS
Bilirubin Urine: NEGATIVE
Hgb urine dipstick: NEGATIVE
Ketones, ur: NEGATIVE
Leukocytes,Ua: NEGATIVE
Nitrite: NEGATIVE
Specific Gravity, Urine: 1.025 (ref 1.000–1.030)
Total Protein, Urine: NEGATIVE
Urine Glucose: NEGATIVE
Urobilinogen, UA: 0.2 (ref 0.0–1.0)
pH: 6 (ref 5.0–8.0)

## 2023-02-14 LAB — COMPREHENSIVE METABOLIC PANEL
ALT: 21 U/L (ref 0–53)
AST: 19 U/L (ref 0–37)
Albumin: 4.4 g/dL (ref 3.5–5.2)
Alkaline Phosphatase: 70 U/L (ref 39–117)
BUN: 12 mg/dL (ref 6–23)
CO2: 30 meq/L (ref 19–32)
Calcium: 9.5 mg/dL (ref 8.4–10.5)
Chloride: 104 meq/L (ref 96–112)
Creatinine, Ser: 0.93 mg/dL (ref 0.40–1.50)
GFR: 88.31 mL/min (ref >60.00)
Glucose, Bld: 106 mg/dL — ABNORMAL HIGH (ref 70–99)
Potassium: 4 meq/L (ref 3.5–5.1)
Sodium: 140 meq/L (ref 135–145)
Total Bilirubin: 0.7 mg/dL (ref 0.2–1.2)
Total Protein: 7 g/dL (ref 6.0–8.3)

## 2023-02-14 LAB — TSH: TSH: 1.48 u[IU]/mL (ref 0.35–5.50)

## 2023-02-16 ENCOUNTER — Encounter: Payer: Self-pay | Admitting: Internal Medicine

## 2023-02-16 ENCOUNTER — Ambulatory Visit (INDEPENDENT_AMBULATORY_CARE_PROVIDER_SITE_OTHER): Payer: 59 | Admitting: Internal Medicine

## 2023-02-16 VITALS — BP 118/70 | HR 70 | Temp 98.3°F | Ht 72.0 in | Wt 188.0 lb

## 2023-02-16 DIAGNOSIS — I10 Essential (primary) hypertension: Secondary | ICD-10-CM | POA: Diagnosis not present

## 2023-02-16 DIAGNOSIS — R944 Abnormal results of kidney function studies: Secondary | ICD-10-CM

## 2023-02-16 DIAGNOSIS — Z0000 Encounter for general adult medical examination without abnormal findings: Secondary | ICD-10-CM | POA: Diagnosis not present

## 2023-02-16 DIAGNOSIS — F419 Anxiety disorder, unspecified: Secondary | ICD-10-CM

## 2023-02-16 MED ORDER — HYDROCODONE-ACETAMINOPHEN 5-325 MG PO TABS: 1.0000 | ORAL_TABLET | Freq: Two times a day (BID) | ORAL | 0 refills | Status: DC | PRN

## 2023-02-16 NOTE — Assessment & Plan Note (Addendum)
Chronic Clonazepam prn rare. Not to take w/Zolpidem  Potential benefits of a long term benzodiazepines  use as well as potential risks  and complications were explained to the patient and were aknowledged. Dale Campbell changed jobs. 2 aunts are in Hospice...passed away. Parents are M 86, F 93 w/dementia

## 2023-02-16 NOTE — Assessment & Plan Note (Signed)
We discussed age appropriate health related issues, including available/recomended screening tests and vaccinations. We discussed a need for adhering to healthy diet and exercise. Labs/EKG were reviewed/ordered. All questions were answered. Cardiac CT calcium scoring offered 2019, 2022 Shingrix 2019 Colon 1/15 due in 2020; 2022; due in 2027 Dr Carlean Purl A cardiac CT scan for calcium scoring offered

## 2023-02-16 NOTE — Assessment & Plan Note (Signed)
Hydrate well ?Monitor GFR ?

## 2023-02-16 NOTE — Assessment & Plan Note (Signed)
On Micardis and Norvasc

## 2023-02-16 NOTE — Progress Notes (Signed)
Subjective:  Patient ID: Dale Campbell, male    DOB: 30-Jul-1960  Age: 62 y.o. MRN: [id number]  CC: Annual Exam   HPI Dale Campbell presents for a well exam Follow-up on low back pain, insomnia   Outpatient Medications Prior to Visit  Medication Sig Dispense Refill   Ascorbic Acid (VITAMIN C PO) Take by mouth.     Cholecalciferol 1000 UNITS capsule Take 1,000 Units by mouth daily.     clonazePAM (KLONOPIN) 0.5 MG tablet TAKE 1 TABLET(0.5 MG) BY MOUTH TWICE DAILY AS NEEDED FOR ANXIETY OR SPASMS 60 tablet 5   omeprazole (PRILOSEC) 20 MG capsule Take 1 capsule (20 mg total) by mouth daily. 90 capsule 3   telmisartan (MICARDIS) 80 MG tablet Take 1 tablet (80 mg total) by mouth daily. TAKE 1 TABLET(80 MG) BY MOUTH DAILY 90 tablet 3   zolpidem (AMBIEN) 10 MG tablet TAKE 1 TABLET(10 MG) BY MOUTH AT BEDTIME AS NEEDED FOR SLEEP 30 tablet 5   HYDROcodone-acetaminophen (NORCO/VICODIN) 5-325 MG tablet Take 1 tablet by mouth 2 (two) times daily as needed for severe pain (pain score 7-10). 60 tablet 0   No facility-administered medications prior to visit.    ROS: Review of Systems  Constitutional:  Negative for appetite change, fatigue and unexpected weight change.  HENT:  Negative for congestion, nosebleeds, sneezing, sore throat and trouble swallowing.   Eyes:  Negative for itching and visual disturbance.  Respiratory:  Negative for cough.   Cardiovascular:  Negative for chest pain, palpitations and leg swelling.  Gastrointestinal:  Negative for abdominal distention, blood in stool, diarrhea and nausea.  Genitourinary:  Negative for frequency and hematuria.  Musculoskeletal:  Positive for back pain. Negative for gait problem, joint swelling and neck pain.  Skin:  Negative for rash.  Neurological:  Negative for dizziness, tremors, speech difficulty and weakness.  Psychiatric/Behavioral:  Negative for agitation, dysphoric mood, sleep disturbance and suicidal ideas. The patient is not  nervous/anxious.     Objective:  BP 118/70 (BP Location: Left Arm, Patient Position: Sitting, Cuff Size: Normal)   Pulse 70   Temp 98.3 F (36.8 C) (Oral)   Ht 6' (1.829 m)   Wt 188 lb (85.3 kg)   SpO2 96%   BMI 25.50 kg/m   BP Readings from Last 3 Encounters:  02/16/23 118/70  12/12/22 (!) 134/90  07/11/22 130/78    Wt Readings from Last 3 Encounters:  02/16/23 188 lb (85.3 kg)  12/12/22 187 lb 3.2 oz (84.9 kg)  07/11/22 185 lb (83.9 kg)    Physical Exam Constitutional:      General: He is not in acute distress.    Appearance: Normal appearance. He is well-developed.     Comments: NAD  Eyes:     Conjunctiva/sclera: Conjunctivae normal.     Pupils: Pupils are equal, round, and reactive to light.  Neck:     Thyroid: No thyromegaly.     Vascular: No JVD.  Cardiovascular:     Rate and Rhythm: Normal rate and regular rhythm.     Heart sounds: Normal heart sounds. No murmur heard.    No friction rub. No gallop.  Pulmonary:     Effort: Pulmonary effort is normal. No respiratory distress.     Breath sounds: Normal breath sounds. No wheezing or rales.  Chest:     Chest wall: No tenderness.  Abdominal:     General: Bowel sounds are normal. There is no distension.     Palpations: Abdomen  is soft. There is no mass.     Tenderness: There is no abdominal tenderness. There is no guarding or rebound.  Musculoskeletal:        General: Tenderness present. Normal range of motion.     Cervical back: Normal range of motion.  Lymphadenopathy:     Cervical: No cervical adenopathy.  Skin:    General: Skin is warm and dry.     Findings: No rash.  Neurological:     Mental Status: He is alert and oriented to person, place, and time.     Cranial Nerves: No cranial nerve deficit.     Motor: No abnormal muscle tone.     Coordination: Coordination normal.     Gait: Gait normal.     Deep Tendon Reflexes: Reflexes are normal and symmetric.  Psychiatric:        Behavior: Behavior  normal.        Thought Content: Thought content normal.        Judgment: Judgment normal.   LS spine tender with range of motion  Lab Results  Component Value Date   WBC 3.5 (L) 02/14/2023   HGB 15.4 02/14/2023   HCT 43.8 02/14/2023   PLT 144.0 (L) 02/14/2023   GLUCOSE 106 (H) 02/14/2023   CHOL 174 02/14/2023   TRIG 124.0 02/14/2023   HDL 31.40 (L) 02/14/2023   LDLDIRECT 159.0 02/11/2021   LDLCALC 117 (H) 02/14/2023   ALT 21 02/14/2023   AST 19 02/14/2023   NA 140 02/14/2023   K 4.0 02/14/2023   CL 104 02/14/2023   CREATININE 0.93 02/14/2023   BUN 12 02/14/2023   CO2 30 02/14/2023   TSH 1.48 02/14/2023   PSA 0.22 02/14/2023   HGBA1C 5.1 05/12/2009    No results found.  Assessment & Plan:   Problem List Items Addressed This Visit     Essential hypertension    On Micardis and Norvasc      Relevant Orders   CBC with Differential/Platelet   Comprehensive metabolic panel   Well adult exam - Primary    We discussed age appropriate health related issues, including available/recomended screening tests and vaccinations. We discussed a need for adhering to healthy diet and exercise. Labs/EKG were reviewed/ordered. All questions were answered. Cardiac CT calcium scoring offered 2019, 2022 Shingrix 2019 Colon 1/15 due in 2020; 2022; due in 2027 Dr Leone Payor A cardiac CT scan for calcium scoring offered       Anxiety    Chronic Clonazepam prn rare. Not to take w/Zolpidem  Potential benefits of a long term benzodiazepines  use as well as potential risks  and complications were explained to the patient and were aknowledged. [name] changed jobs. 2 aunts are in Hospice...passed away. Parents are M 86, F 93 w/dementia      Decreased GFR    Hydrate well Monitor GFR      Relevant Orders   CBC with Differential/Platelet   Comprehensive metabolic panel      Meds ordered this encounter  Medications   HYDROcodone-acetaminophen (NORCO/VICODIN) 5-325 MG tablet    Sig: Take  1 tablet by mouth 2 (two) times daily as needed for severe pain (pain score 7-10).    Dispense:  60 tablet    Refill:  0    Schedule office visit every 3 months      Follow-up: Return in about 3 months (around 05/17/2023) for a follow-up visit.  Sonda Primes, MD

## 2023-03-27 ENCOUNTER — Other Ambulatory Visit: Payer: Self-pay | Admitting: Internal Medicine

## 2023-03-28 ENCOUNTER — Telehealth: Payer: Self-pay

## 2023-03-28 MED ORDER — HYDROCODONE-ACETAMINOPHEN 5-325 MG PO TABS: 1.0000 | ORAL_TABLET | Freq: Two times a day (BID) | ORAL | 0 refills | Status: DC | PRN

## 2023-03-28 NOTE — Telephone Encounter (Signed)
*  Primary  Pharmacy Patient Advocate Encounter   Received notification from CoverMyMeds that prior authorization for HYDROcodone -Acetaminophen  5-325MG  tablets  is required/requested.   Insurance verification completed.   The patient is insured through [facility] .   Per test claim: PA required; PA submitted to above mentioned insurance via CoverMyMeds Key/confirmation #/EOC [id number] Status is pending

## 2023-03-30 ENCOUNTER — Other Ambulatory Visit (HOSPITAL_COMMUNITY): Payer: Self-pay

## 2023-03-30 NOTE — Telephone Encounter (Signed)
Pharmacy Patient Advocate Encounter  Received notification from [facility] that Prior Authorization for HYDROcodone-Acetaminophen 5-325MG  tablets  has been APPROVED from 03/30/23 to 09/25/23   PA #/Case ID/Reference #: [id number]

## 2023-05-02 ENCOUNTER — Other Ambulatory Visit: Payer: Self-pay | Admitting: Internal Medicine

## 2023-05-08 ENCOUNTER — Other Ambulatory Visit: Payer: Self-pay | Admitting: Internal Medicine

## 2023-05-08 MED ORDER — HYDROCODONE-ACETAMINOPHEN 5-325 MG PO TABS: 1.0000 | ORAL_TABLET | Freq: Two times a day (BID) | ORAL | 0 refills | Status: DC | PRN

## 2023-05-08 NOTE — Telephone Encounter (Signed)
 Last Fill: 03/28/23 60 tabs/0 refill  Last OV: 02/16/23 Next OV: 05/30/22  Routing to provider for review/authorization.

## 2023-05-08 NOTE — Telephone Encounter (Signed)
 Copied from CRM [id number]. Topic: Clinical - Medication Refill >> May 08, 2023  8:24 AM Lennart Pall wrote: Most Recent Primary Care Visit:  Provider: Tresa Garter  Department: [facility]  Visit Type: PHYSICAL  Date: 02/16/2023  Medication: HYDROcodone-acetaminophen (NORCO/VICODIN) 5-325 MG tablet   Has the patient contacted their pharmacy? Yes (Agent: If no, request that the patient contact the pharmacy for the refill. If patient does not wish to contact the pharmacy document the reason why and proceed with request.) (Agent: If yes, when and what did the pharmacy advise?)  Is this the correct pharmacy for this prescription? Yes If no, delete pharmacy and type the correct one.  This is the patient's preferred pharmacy:  [id number] [place], [place] - [address] [place] [address] Phone: [phone] Fax: [phone]    Has the prescription been filled recently? Yes  Is the patient out of the medication? Yes  Has the patient been seen for an appointment in the last year OR does the patient have an upcoming appointment? Yes  Can we respond through MyChart? Yes  Agent: Please be advised that Rx refills may take up to 3 business days. We ask that you follow-up with your pharmacy.

## 2023-05-26 ENCOUNTER — Other Ambulatory Visit (INDEPENDENT_AMBULATORY_CARE_PROVIDER_SITE_OTHER)

## 2023-05-26 DIAGNOSIS — I10 Essential (primary) hypertension: Secondary | ICD-10-CM

## 2023-05-26 DIAGNOSIS — R944 Abnormal results of kidney function studies: Secondary | ICD-10-CM

## 2023-05-26 LAB — COMPREHENSIVE METABOLIC PANEL
ALT: 21 U/L (ref 0–53)
AST: 17 U/L (ref 0–37)
Albumin: 4.3 g/dL (ref 3.5–5.2)
Alkaline Phosphatase: 72 U/L (ref 39–117)
BUN: 12 mg/dL (ref 6–23)
CO2: 27 meq/L (ref 19–32)
Calcium: 9.5 mg/dL (ref 8.4–10.5)
Chloride: 103 meq/L (ref 96–112)
Creatinine, Ser: 0.97 mg/dL (ref 0.40–1.50)
GFR: 83.79 mL/min (ref >60.00)
Glucose, Bld: 120 mg/dL — ABNORMAL HIGH (ref 70–99)
Potassium: 4 meq/L (ref 3.5–5.1)
Sodium: 139 meq/L (ref 135–145)
Total Bilirubin: 0.5 mg/dL (ref 0.2–1.2)
Total Protein: 7.2 g/dL (ref 6.0–8.3)

## 2023-05-26 LAB — CBC WITH DIFFERENTIAL/PLATELET
Basophils Absolute: 0 10*3/uL (ref 0.0–0.1)
Basophils Relative: 0.7 % (ref 0.0–3.0)
Eosinophils Absolute: 0.1 10*3/uL (ref 0.0–0.7)
Eosinophils Relative: 2.6 % (ref 0.0–5.0)
HCT: 42.5 % (ref 39.0–52.0)
Hemoglobin: 15 g/dL (ref 13.0–17.0)
Lymphocytes Relative: 34.1 % (ref 12.0–46.0)
Lymphs Abs: 1.3 10*3/uL (ref 0.7–4.0)
MCHC: 35.2 g/dL (ref 30.0–36.0)
MCV: 90.4 fl (ref 78.0–100.0)
Monocytes Absolute: 0.4 10*3/uL (ref 0.1–1.0)
Monocytes Relative: 11.1 % (ref 3.0–12.0)
Neutro Abs: 1.9 10*3/uL (ref 1.4–7.7)
Neutrophils Relative %: 51.5 % (ref 43.0–77.0)
Platelets: 154 10*3/uL (ref 150.0–400.0)
RBC: 4.7 Mil/uL (ref 4.22–5.81)
RDW: 12.6 % (ref 11.5–15.5)
WBC: 3.7 10*3/uL — ABNORMAL LOW (ref 4.0–10.5)

## 2023-05-30 ENCOUNTER — Encounter: Payer: Self-pay | Admitting: Internal Medicine

## 2023-05-30 ENCOUNTER — Telehealth (INDEPENDENT_AMBULATORY_CARE_PROVIDER_SITE_OTHER): Payer: 59 | Admitting: Internal Medicine

## 2023-05-30 DIAGNOSIS — K529 Noninfective gastroenteritis and colitis, unspecified: Secondary | ICD-10-CM | POA: Insufficient documentation

## 2023-05-30 DIAGNOSIS — G4709 Other insomnia: Secondary | ICD-10-CM | POA: Diagnosis not present

## 2023-05-30 MED ORDER — ZOLPIDEM TARTRATE 10 MG PO TABS: ORAL_TABLET | ORAL | 1 refills | Status: DC

## 2023-05-30 NOTE — Progress Notes (Signed)
 Virtual Visit via Video Note  I connected with Dale [place] on 05/30/23 at  3:40 PM EDT by a video enabled telemedicine application and verified that I am speaking with the correct person using two identifiers.   I discussed the limitations of evaluation and management by telemedicine and the availability of in person appointments. The patient expressed understanding and agreed to proceed.  I was located at our [facility] office. The patient was at home. There was no one else present in the visit.  Chief Complaint  Patient presents with   Medical Management of Chronic Issues     History of Present Illness: Dale [place] presents for diarrhea x 24 hrs.  It started last night.  It is better this afternoon.  He is taking over-the-counter meds, hydrating well F/u insomnia  Review of Systems  Constitutional:  Negative for diaphoresis.  Gastrointestinal:  Positive for diarrhea. Negative for abdominal pain and blood in stool.  Genitourinary:  Negative for dysuria.  Musculoskeletal:  Positive for back pain and joint pain.  Neurological:  Positive for weakness.  Psychiatric/Behavioral:  The patient has insomnia.      Observations/Objective: The patient appears to be in no acute distress.  He looks tired  Assessment and Plan:  Problem List Items Addressed This Visit     Gastroenteritis - Primary   Likely viral.  Improving.  Note for work provided.  Continue to use Imodium A-D, good hydration.        Meds ordered this encounter  Medications   zolpidem (AMBIEN) 10 MG tablet    Sig: TAKE 1 TABLET(10 MG) BY MOUTH AT BEDTIME AS NEEDED FOR SLEEP    Dispense:  90 tablet    Refill:  1     Follow Up Instructions:    I discussed the assessment and treatment plan with the patient. The patient was provided an opportunity to ask questions and all were answered. The patient agreed with the plan and demonstrated an understanding of the instructions.   The patient was advised  to call back or seek an in-person evaluation if the symptoms worsen or if the condition fails to improve as anticipated.  I provided face-to-face time during this encounter. We were at different locations.   Sonda Primes, MD

## 2023-05-30 NOTE — Assessment & Plan Note (Signed)
 Continue with zolpidem  Potential benefits of a long term benzodiazepines  use as well as potential risks  and complications were explained to the patient and were aknowledged.

## 2023-05-30 NOTE — Assessment & Plan Note (Signed)
 Likely viral.  Improving.  Note for work provided.  Continue to use Imodium A-D, good hydration.

## 2023-05-30 NOTE — Progress Notes (Deleted)
   Subjective:  Patient ID: Dale [place], male    DOB: Mar 01, 1961  Age: 63 y.o. MRN: [id number]  CC: Medical Management of Chronic Issues   HPI Dale [place] presents for diarrhea x 24 hrs F/u insomnia  Outpatient Medications Prior to Visit  Medication Sig Dispense Refill   Ascorbic Acid (VITAMIN C PO) Take by mouth.     Cholecalciferol 1000 UNITS capsule Take 1,000 Units by mouth daily.     clonazePAM (KLONOPIN) 0.5 MG tablet TAKE 1 TABLET(0.5 MG) BY MOUTH TWICE DAILY AS NEEDED FOR ANXIETY OR SPASMS 60 tablet 5   HYDROcodone-acetaminophen (NORCO/VICODIN) 5-325 MG tablet Take 1 tablet by mouth 2 (two) times daily as needed for severe pain (pain score 7-10). 60 tablet 0   omeprazole (PRILOSEC) 20 MG capsule Take 1 capsule (20 mg total) by mouth daily. 90 capsule 3   telmisartan (MICARDIS) 80 MG tablet Take 1 tablet (80 mg total) by mouth daily. TAKE 1 TABLET(80 MG) BY MOUTH DAILY 90 tablet 3   zolpidem (AMBIEN) 10 MG tablet TAKE 1 TABLET(10 MG) BY MOUTH AT BEDTIME AS NEEDED FOR SLEEP 30 tablet 5   No facility-administered medications prior to visit.    ROS: Review of Systems  Objective:  There were no vitals taken for this visit.  BP Readings from Last 3 Encounters:  02/16/23 118/70  12/12/22 (!) 134/90  07/11/22 130/78    Wt Readings from Last 3 Encounters:  02/16/23 188 lb (85.3 kg)  12/12/22 187 lb 3.2 oz (84.9 kg)  07/11/22 185 lb (83.9 kg)    Physical Exam  Lab Results  Component Value Date   WBC 3.7 (L) 05/26/2023   HGB 15.0 05/26/2023   HCT 42.5 05/26/2023   PLT 154.0 05/26/2023   GLUCOSE 120 (H) 05/26/2023   CHOL 174 02/14/2023   TRIG 124.0 02/14/2023   HDL 31.40 (L) 02/14/2023   LDLDIRECT 159.0 02/11/2021   LDLCALC 117 (H) 02/14/2023   ALT 21 05/26/2023   AST 17 05/26/2023   NA 139 05/26/2023   K 4.0 05/26/2023   CL 103 05/26/2023   CREATININE 0.97 05/26/2023   BUN 12 05/26/2023   CO2 27 05/26/2023   TSH 1.48 02/14/2023   PSA 0.22  02/14/2023   HGBA1C 5.1 05/12/2009    No results found.  Assessment & Plan:   Problem List Items Addressed This Visit   None     No orders of the defined types were placed in this encounter.     Follow-up: No follow-ups on file.  Sonda Primes, MD

## 2023-06-06 ENCOUNTER — Other Ambulatory Visit: Payer: Self-pay | Admitting: Internal Medicine

## 2023-06-10 ENCOUNTER — Ambulatory Visit (HOSPITAL_COMMUNITY): Admission: EM | Admit: 2023-06-10 | Discharge: 2023-06-10 | Disposition: A | Discharge status: Home / Self Care

## 2023-06-10 ENCOUNTER — Encounter (HOSPITAL_COMMUNITY): Payer: Self-pay | Admitting: Emergency Medicine

## 2023-06-10 DIAGNOSIS — T161XXA Foreign body in right ear, initial encounter: Secondary | ICD-10-CM

## 2023-06-10 DIAGNOSIS — H60391 Other infective otitis externa, right ear: Secondary | ICD-10-CM | POA: Diagnosis not present

## 2023-06-10 MED ORDER — METHYLPREDNISOLONE 4 MG PO TBPK: ORAL_TABLET | ORAL | 0 refills | Status: DC

## 2023-06-10 MED ORDER — CIPROFLOXACIN-DEXAMETHASONE 0.3-0.1 % OT SUSP: 4.0000 [drp] | Freq: Two times a day (BID) | OTIC | 0 refills | Status: DC

## 2023-06-10 NOTE — ED Provider Notes (Signed)
 [facility] [place]  Note:  This document was prepared using Dragon voice recognition software and may include unintentional dictation errors.  MRN: [id number] DOB: Jan 05, 1961  Subjective:   Dale [place] is a 63 y.o. male presenting for foreign body in right ear canal.  Patient reports he was cleaning his ears due to psoriasis with a Q-tip when the tip of the Q-tip came off inside his ear canal.  Patient reports he tried to retrieve tap at home but was unsuccessful.  Patient reports mild right ear canal pain with manipulation.  Denies any drainage, severe pain, loss of hearing.   No current facility-administered medications for this encounter.  Current Outpatient Medications:    ciprofloxacin-dexamethasone (CIPRODEX) OTIC suspension, Place 4 drops into the right ear 2 (two) times daily., Disp: 7.5 mL, Rfl: 0   methylPREDNISolone (MEDROL DOSEPAK) 4 MG TBPK tablet, Take prescribed medication as directed for 5 days.  Medication will be tapered over 5-day period.  Take entire dose of medication until complete., Disp: 21 each, Rfl: 0   Ascorbic Acid (VITAMIN C PO), Take by mouth., Disp: , Rfl:    Cholecalciferol 1000 UNITS capsule, Take 1,000 Units by mouth daily., Disp: , Rfl:    clonazePAM (KLONOPIN) 0.5 MG tablet, TAKE 1 TABLET(0.5 MG) BY MOUTH TWICE DAILY AS NEEDED FOR ANXIETY OR SPASMS, Disp: 60 tablet, Rfl: 5   HYDROcodone-acetaminophen (NORCO/VICODIN) 5-325 MG tablet, Take 1 tablet by mouth 2 (two) times daily as needed for severe pain (pain score 7-10)., Disp: 60 tablet, Rfl: 0   omeprazole (PRILOSEC) 20 MG capsule, Take 1 capsule (20 mg total) by mouth daily., Disp: 90 capsule, Rfl: 3   telmisartan (MICARDIS) 80 MG tablet, Take 1 tablet (80 mg total) by mouth daily. TAKE 1 TABLET(80 MG) BY MOUTH DAILY, Disp: 90 tablet, Rfl: 3   zolpidem (AMBIEN) 10 MG tablet, TAKE 1 TABLET(10 MG) BY MOUTH AT BEDTIME AS NEEDED FOR SLEEP, Disp: 90 tablet, Rfl: 1   Allergies  Allergen Reactions    Doxycycline     REACTION: rash   Paxil [Paroxetine]     Arthralgia, no sex drive    Past Medical History:  Diagnosis Date   Allergic rhinitis    Allergy    CELLULITIS, ARM 04/30/2009   Qualifier: Diagnosis of  By: Tora Perches     Eczema    ED (erectile dysfunction)    Elevated glucose    Fatty liver    GERD (gastroesophageal reflux disease)    HTN (hypertension)    Hyperlipidemia    Internal hemorrhoids with bleeding, Grade 2 prolapse 05/06/2013       Personal history of colonic adenomas 04/01/2013   Pneumothorax    Snoring    ? poss OSA     Past Surgical History:  Procedure Laterality Date   COLONOSCOPY  2015   WISDOM TOOTH EXTRACTION  1990    Family History  Problem Relation Age of Onset   CAD Father    Heart disease Father    Diabetes Father        Type 2   Breast cancer Mother    Breast cancer Sister    Psoriasis Neg Hx    Colon cancer Neg Hx    Esophageal cancer Neg Hx    Stomach cancer Neg Hx    Colon polyps Neg Hx    Rectal cancer Neg Hx     Social History   Tobacco Use   Smoking status: Former    Current packs/day: 0.00  Types: Cigarettes    Quit date: 03/14/2006    Years since quitting: 17.2   Smokeless tobacco: Never  Vaping Use   Vaping status: Never Used  Substance Use Topics   Alcohol use: No    Comment: occasionally   Drug use: No    ROS Refer to HPI for ROS details.  Objective:   Vitals: BP (!) 162/95 (BP Location: Right Arm)   Pulse 72   Temp 98.1 F (36.7 C) (Oral)   Resp 15   SpO2 96%   Physical Exam Vitals and nursing note reviewed.  Constitutional:      General: He is not in acute distress.    Appearance: Normal appearance. He is well-developed. He is not ill-appearing or toxic-appearing.  HENT:     Head: Normocephalic and atraumatic.     Right Ear: Tympanic membrane and external ear normal. A foreign body (Cotton from Q-tip lodged in ear canal, irrigation and manual retrieval with forceps.) is present.      Left Ear: Tympanic membrane, ear canal and external ear normal.  Cardiovascular:     Rate and Rhythm: Normal rate.  Pulmonary:     Effort: Pulmonary effort is normal. No respiratory distress.  Abdominal:     Palpations: Abdomen is soft.  Skin:    General: Skin is warm and dry.  Neurological:     General: No focal deficit present.     Mental Status: He is alert and oriented to person, place, and time.  Psychiatric:        Mood and Affect: Mood normal.     Foreign Body Removal  Date/Time: 06/10/2023 6:32 PM  Performed by: Lucky Cowboy, NP Authorized by: Lucky Cowboy, NP   Consent:    Consent obtained:  Verbal   Consent given by:  Patient   Risks, benefits, and alternatives were discussed: yes   Universal protocol:    Patient identity confirmed:  Verbally with patient and arm band Location:    Location:  Ear   Ear location:  R ear   Tendon involvement:  None Anesthesia:    Anesthesia method:  None Procedure type:    Procedure complexity:  Simple Procedure details:    Removal mechanism:  Forceps and irrigation   Foreign bodies recovered:  None   Intact foreign body removal: no   Post-procedure details:    Neurovascular status: intact     Confirmation:  Residual foreign bodies remain   Procedure completion:  Procedure terminated electively by provider Comments:     Unable to completely remove foreign body due to ear canal inflammation.  Will treat with oral steroids and otic antibiotic/steroid drops and repeat removal attempt on Wednesday when inflammation has improved.   No results found for this or any previous visit (from the past 24 hours).  Assessment and Plan :   PDMP not reviewed this encounter.  1. Foreign body of right ear, initial encounter   2. Infective otitis externa of right ear    1. Foreign body of right ear, initial encounter (Primary) - Foreign Body Removal attempted in UC but unsuccessful due to ear canal inflammation.  Will  reattempt on Wednesday when inflammation has improved   2. Infective otitis externa of right ear - ciprofloxacin-dexamethasone (CIPRODEX) OTIC suspension; Place 4 drops into the right ear 2 (two) times daily.  Dispense: 7.5 mL; Refill: 0 - methylPREDNISolone (MEDROL DOSEPAK) 4 MG TBPK tablet; Take prescribed medication as directed for 5 days.  Medication will be tapered  over 5-day period.  Take entire dose of medication until complete.  Dispense: 21 each; Refill: 0 -Continue to monitor symptoms for any change in severity if there is any escalation of current symptoms or development of new symptoms follow-up in ER for further evaluation and management.  Lucky Cowboy   [place], [place], [place] 06/10/23 [phone]

## 2023-06-10 NOTE — ED Triage Notes (Signed)
 Pt reports has psoriasis in ears and using a Q-tip to scratch inside ears.  Cotton tip came off in in right ear. Reports tried getting out at home but not successful. Repots pain

## 2023-06-10 NOTE — Discharge Instructions (Addendum)
 1. Foreign body of right ear, initial encounter (Primary) - Foreign Body Removal attempted in UC but unsuccessful due to ear canal inflammation.  Will reattempt on Wednesday when inflammation has improved   2. Infective otitis externa of right ear - ciprofloxacin-dexamethasone (CIPRODEX) OTIC suspension; Place 4 drops into the right ear 2 (two) times daily.  Dispense: 7.5 mL; Refill: 0 - methylPREDNISolone (MEDROL DOSEPAK) 4 MG TBPK tablet; Take prescribed medication as directed for 5 days.  Medication will be tapered over 5-day period.  Take entire dose of medication until complete.  Dispense: 21 each; Refill: 0

## 2023-06-12 MED ORDER — HYDROCODONE-ACETAMINOPHEN 5-325 MG PO TABS: 1.0000 | ORAL_TABLET | Freq: Two times a day (BID) | ORAL | 0 refills | Status: DC | PRN

## 2023-06-14 ENCOUNTER — Encounter (HOSPITAL_COMMUNITY): Payer: Self-pay | Admitting: *Deleted

## 2023-06-14 ENCOUNTER — Other Ambulatory Visit: Payer: Self-pay

## 2023-06-14 ENCOUNTER — Ambulatory Visit (HOSPITAL_COMMUNITY)
Admission: EM | Admit: 2023-06-14 | Discharge: 2023-06-14 | Disposition: A | Discharge status: Home / Self Care | Attending: Emergency Medicine | Admitting: Emergency Medicine

## 2023-06-14 DIAGNOSIS — T161XXA Foreign body in right ear, initial encounter: Secondary | ICD-10-CM | POA: Diagnosis not present

## 2023-06-14 NOTE — Discharge Instructions (Addendum)
 Continue medications prescribed at previous visit until finished.  Refrain from using Q-tips in the future, to avoid any potential foreign bodies in the ear canal.  Return to clinic for any new or urgent symptoms.

## 2023-06-14 NOTE — ED Triage Notes (Signed)
 PT has the cotton tip of Q-tip in RT ear. Pt unable to remove it last weekend. Pt reports the era was swollen

## 2023-06-14 NOTE — ED Provider Notes (Signed)
 [facility]    CSN: [id number] Arrival date & time: 06/14/23  1711      History   Chief Complaint Chief Complaint  Patient presents with   Foreign Body in Ear    HPI Dale [place] is a 63 y.o. male.   Patient presents to clinic over concerns of the end of a cotton Q-tip remaining in his right ear.  He was seen at this clinic on Saturday for evaluation but due to swelling of the ear canal staff was unable to manually remove the cotton.  He has been taking the antibiotic eardrops and the steroids as prescribed.  Reports a foreign body sensation to the right ear and muffled hearing.  Denies any current pain.    The history is provided by the patient and medical records.  Foreign Body in Ear    Past Medical History:  Diagnosis Date   Allergic rhinitis    Allergy    CELLULITIS, ARM 04/30/2009   Qualifier: Diagnosis of  By: Tora Perches     Eczema    ED (erectile dysfunction)    Elevated glucose    Fatty liver    GERD (gastroesophageal reflux disease)    HTN (hypertension)    Hyperlipidemia    Internal hemorrhoids with bleeding, Grade 2 prolapse 05/06/2013       Personal history of colonic adenomas 04/01/2013   Pneumothorax    Snoring    ? poss OSA    Patient Active Problem List   Diagnosis Date Noted   Gastroenteritis 05/30/2023   Hand arthritis 07/11/2022   Meteorism 11/23/2021   Onychomycosis 11/23/2021   Elevated liver enzymes 02/14/2021   Decreased GFR 02/14/2021   Sciatic leg pain 02/11/2021   Disorder of Eustachian tube, left 10/05/2020   Anxiety 08/15/2019   Right rotator cuff tear 12/31/2018   Colon polyps 11/26/2018   Shoulder pain 10/03/2018   Conjunctivitis 03/16/2018   Elevated LFTs 08/22/2015   Ridged nails 08/21/2015   GERD (gastroesophageal reflux disease) 09/25/2013   Internal hemorrhoids with bleeding, Grade 2 prolapse 05/06/2013   History of colonic polyps 04/01/2013   Well adult exam 02/26/2013   Dyslipidemia 02/26/2013    Insomnia 05/07/2012   Erectile dysfunction 01/16/2012   TMJ (temporomandibular joint disorder) 10/18/2011   Otitis media 06/22/2011   Neck pain 04/22/2011   Acute sinusitis 03/09/2011   OLECRANON BURSITIS, LEFT 04/30/2009   Essential hypertension 02/12/2009   DYSPNEA 02/12/2009   SNORING 02/12/2009   TOBACCO USE, QUIT 02/12/2009   Allergic rhinitis 07/13/2007   PRURITUS 07/13/2007   Rash and other nonspecific skin eruption 07/13/2007    Past Surgical History:  Procedure Laterality Date   COLONOSCOPY  2015   WISDOM TOOTH EXTRACTION  1990       Home Medications    Prior to Admission medications   Medication Sig Start Date End Date Taking? Authorizing Provider  Ascorbic Acid (VITAMIN C PO) Take by mouth.    [provider]  Cholecalciferol 1000 UNITS capsule Take 1,000 Units by mouth daily.    [provider]  ciprofloxacin-dexamethasone (CIPRODEX) OTIC suspension Place 4 drops into the right ear 2 (two) times daily. 06/10/23   Reddick, Johnathan B, NP  clonazePAM (KLONOPIN) 0.5 MG tablet TAKE 1 TABLET(0.5 MG) BY MOUTH TWICE DAILY AS NEEDED FOR ANXIETY OR SPASMS 12/12/22   Plotnikov, Georgina Quint, MD  HYDROcodone-acetaminophen (NORCO/VICODIN) 5-325 MG tablet Take 1 tablet by mouth 2 (two) times daily as needed for severe pain (pain score  7-10). 06/12/23 06/11/24  Plotnikov, Georgina Quint, MD  methylPREDNISolone (MEDROL DOSEPAK) 4 MG TBPK tablet Take prescribed medication as directed for 5 days.  Medication will be tapered over 5-day period.  Take entire dose of medication until complete. 06/10/23   Reddick, Nicola Girt B, NP  omeprazole (PRILOSEC) 20 MG capsule Take 1 capsule (20 mg total) by mouth daily. 12/12/22   Plotnikov, Georgina Quint, MD  telmisartan (MICARDIS) 80 MG tablet Take 1 tablet (80 mg total) by mouth daily. TAKE 1 TABLET(80 MG) BY MOUTH DAILY 12/12/22   Plotnikov, Georgina Quint, MD  zolpidem (AMBIEN) 10 MG tablet TAKE 1 TABLET(10 MG) BY MOUTH AT BEDTIME AS NEEDED FOR  SLEEP 05/30/23   Plotnikov, Georgina Quint, MD    Family History Family History  Problem Relation Age of Onset   CAD Father    Heart disease Father    Diabetes Father        Type 2   Breast cancer Mother    Breast cancer Sister    Psoriasis Neg Hx    Colon cancer Neg Hx    Esophageal cancer Neg Hx    Stomach cancer Neg Hx    Colon polyps Neg Hx    Rectal cancer Neg Hx     Social History Social History   Tobacco Use   Smoking status: Former    Current packs/day: 0.00    Types: Cigarettes    Quit date: 03/14/2006    Years since quitting: 17.2   Smokeless tobacco: Never  Vaping Use   Vaping status: Never Used  Substance Use Topics   Alcohol use: No    Comment: occasionally   Drug use: No     Allergies   Doxycycline and Paxil [paroxetine]   Review of Systems Review of Systems  Per HPI  Physical Exam Triage Vital Signs ED Triage Vitals  Encounter Vitals Group     BP 06/14/23 1805 (!) 148/77     Systolic BP Percentile --      Diastolic BP Percentile --      Pulse Rate 06/14/23 1805 62     Resp 06/14/23 1805 18     Temp 06/14/23 1805 98.5 F (36.9 C)     Temp src --      SpO2 06/14/23 1805 96 %     Weight --      Height --      Head Circumference --      Peak Flow --      Pain Score 06/14/23 1804 0     Pain Loc --      Pain Education --      Exclude from Growth Chart --    No data found.  Updated Vital Signs BP (!) 148/77   Pulse 62   Temp 98.5 F (36.9 C)   Resp 18   SpO2 96%   Visual Acuity Right Eye Distance:   Left Eye Distance:   Bilateral Distance:    Right Eye Near:   Left Eye Near:    Bilateral Near:     Physical Exam Vitals and nursing note reviewed.  Constitutional:      Appearance: Normal appearance.  HENT:     Head: Normocephalic and atraumatic.     Right Ear: A foreign body is present.     Ears:     Comments: Moist foreign body attached to the ear canal, deep within.  Appears to be the cotton end of a Q-tip.    Nose: Nose  normal.     Mouth/Throat:     Mouth: Mucous membranes are moist.  Eyes:     Conjunctiva/sclera: Conjunctivae normal.  Cardiovascular:     Rate and Rhythm: Normal rate.  Pulmonary:     Effort: Pulmonary effort is normal. No respiratory distress.  Skin:    General: Skin is warm.  Neurological:     General: No focal deficit present.     Mental Status: He is alert.  Psychiatric:        Mood and Affect: Mood normal.        Behavior: Behavior is cooperative.      UC Treatments / Results  Labs (all labs ordered are listed, but only abnormal results are displayed) Labs Reviewed - No data to display  EKG   Radiology No results found.  Procedures Procedures (including critical care time)  Medications Ordered in UC Medications - No data to display  Initial Impression / Assessment and Plan / UC Course  I have reviewed the triage vital signs and the nursing notes.  Pertinent labs & imaging results that were available during my care of the patient were reviewed by me and considered in my medical decision making (see chart for details).  Vitals and triage reviewed, patient is hemodynamically stable.  Right-sided retained foreign body, appears to be the cotton end of a Q-tip, it is moist and deep within the canal.  Staff to perform irrigation, did not remove FB. On repeat exam, cotton visualized and able to removed with tweezers.  Encouraged to continue antibiotic drops and steroids as prescribed.  External ear canal inflammation appears to have improved.  Plan of care, follow-up care return precautions given, no questions at this time.    Final Clinical Impressions(s) / UC Diagnoses   Final diagnoses:  Foreign body of right ear, initial encounter     Discharge Instructions      Continue medications prescribed at previous visit until finished.  Refrain from using Q-tips in the future, to avoid any potential foreign bodies in the ear canal.  Return to clinic for any new or  urgent symptoms.     ED Prescriptions   None    PDMP not reviewed this encounter.   Rinaldo Ratel [place], [place] 06/14/23 [phone]

## 2023-06-19 ENCOUNTER — Encounter: Payer: Self-pay | Admitting: Internal Medicine

## 2023-07-19 ENCOUNTER — Other Ambulatory Visit: Payer: Self-pay | Admitting: Internal Medicine

## 2023-07-20 ENCOUNTER — Other Ambulatory Visit: Payer: Self-pay | Admitting: Internal Medicine

## 2023-07-20 MED ORDER — HYDROCODONE-ACETAMINOPHEN 5-325 MG PO TABS: 1.0000 | ORAL_TABLET | Freq: Two times a day (BID) | ORAL | 0 refills | Status: DC | PRN

## 2023-08-24 ENCOUNTER — Ambulatory Visit: Admitting: Internal Medicine

## 2023-08-24 ENCOUNTER — Encounter: Payer: Self-pay | Admitting: Internal Medicine

## 2023-08-24 VITALS — BP 124/60 | HR 76 | Temp 98.2°F | Ht 72.0 in | Wt 183.0 lb

## 2023-08-24 DIAGNOSIS — M79672 Pain in left foot: Secondary | ICD-10-CM | POA: Diagnosis not present

## 2023-08-24 DIAGNOSIS — M79673 Pain in unspecified foot: Secondary | ICD-10-CM | POA: Insufficient documentation

## 2023-08-24 DIAGNOSIS — M542 Cervicalgia: Secondary | ICD-10-CM | POA: Diagnosis not present

## 2023-08-24 DIAGNOSIS — G4709 Other insomnia: Secondary | ICD-10-CM

## 2023-08-24 DIAGNOSIS — I10 Essential (primary) hypertension: Secondary | ICD-10-CM | POA: Diagnosis not present

## 2023-08-24 MED ORDER — HYDROCODONE-ACETAMINOPHEN 5-325 MG PO TABS: 1.0000 | ORAL_TABLET | Freq: Two times a day (BID) | ORAL | 0 refills | Status: DC | PRN

## 2023-08-24 NOTE — Assessment & Plan Note (Signed)
 L foot 5th digit pain/corn Podiatry ref

## 2023-08-24 NOTE — Assessment & Plan Note (Signed)
Musculoskeletal pain  Norco prn  Potential benefits of a long term opioids use as well as potential risks (i.e. addiction risk, apnea etc) and complications (i.e. Somnolence, constipation and others) were explained to the patient and were aknowledged.

## 2023-08-24 NOTE — Progress Notes (Signed)
 Subjective:  Patient ID: Dale Campbell, male    DOB: [birth year]  Age: 63 y.o. MRN: [id number]  CC: Medical Management of Chronic Issues (Pt.states he has a rash with redness  x left upper abdominal don't itch, left pinky toe has a corn or wart possible hurts very bad if he didn't keep it separated and two bumps on his top of his left ear...)   HPI CAELUM FEDERICI presents for L foot 5th digit pain Ful corn F/u on neck pain, insomnia  Outpatient Medications Prior to Visit  Medication Sig Dispense Refill   Ascorbic Acid (VITAMIN C PO) Take by mouth.     Cholecalciferol 1000 UNITS capsule Take 1,000 Units by mouth daily.     ciprofloxacin -dexamethasone  (CIPRODEX ) OTIC suspension Place 4 drops into the right ear 2 (two) times daily. 7.5 mL 0   clonazePAM  (KLONOPIN ) 0.5 MG tablet TAKE 1 TABLET(0.5 MG) BY MOUTH TWICE DAILY AS NEEDED FOR ANXIETY OR SPASMS 60 tablet 2   methylPREDNISolone  (MEDROL  DOSEPAK) 4 MG TBPK tablet Take prescribed medication as directed for 5 days.  Medication will be tapered over 5-day period.  Take entire dose of medication until complete. 21 each 0   omeprazole  (PRILOSEC) 20 MG capsule Take 1 capsule (20 mg total) by mouth daily. 90 capsule 3   telmisartan  (MICARDIS ) 80 MG tablet Take 1 tablet (80 mg total) by mouth daily. TAKE 1 TABLET(80 MG) BY MOUTH DAILY 90 tablet 3   zolpidem  (AMBIEN ) 10 MG tablet TAKE 1 TABLET(10 MG) BY MOUTH AT BEDTIME AS NEEDED FOR SLEEP 90 tablet 1   HYDROcodone -acetaminophen  (NORCO/VICODIN) 5-325 MG tablet Take 1 tablet by mouth 2 (two) times daily as needed for severe pain (pain score 7-10). 60 tablet 0   No facility-administered medications prior to visit.    ROS: Review of Systems  Constitutional:  Positive for fatigue. Negative for appetite change and unexpected weight change.  HENT:  Negative for congestion, nosebleeds, sneezing, sore throat and trouble swallowing.   Eyes:  Negative for itching and visual disturbance.   Respiratory:  Negative for cough.   Cardiovascular:  Negative for chest pain, palpitations and leg swelling.  Gastrointestinal:  Negative for abdominal distention, blood in stool, diarrhea and nausea.  Genitourinary:  Negative for frequency and hematuria.  Musculoskeletal:  Positive for neck pain and neck stiffness. Negative for back pain, gait problem and joint swelling.  Skin:  Negative for rash.  Neurological:  Negative for dizziness, tremors, speech difficulty and weakness.  Psychiatric/Behavioral:  Positive for sleep disturbance. Negative for agitation and dysphoric mood. The patient is not nervous/anxious.     Objective:  BP 124/60   Pulse 76   Temp 98.2 F (36.8 C) (Oral)   Ht 6' (1.829 m)   Wt 183 lb (83 kg)   SpO2 97%   BMI 24.82 kg/m   BP Readings from Last 3 Encounters:  08/24/23 124/60  06/14/23 (!) 148/77  06/10/23 (!) 162/95    Wt Readings from Last 3 Encounters:  08/24/23 183 lb (83 kg)  02/16/23 188 lb (85.3 kg)  12/12/22 187 lb 3.2 oz (84.9 kg)    Physical Exam Constitutional:      General: He is not in acute distress.    Appearance: Normal appearance. He is well-developed.     Comments: NAD   Eyes:     Conjunctiva/sclera: Conjunctivae normal.     Pupils: Pupils are equal, round, and reactive to light.   Neck:  Thyroid : No thyromegaly.     Vascular: No JVD.   Cardiovascular:     Rate and Rhythm: Normal rate and regular rhythm.     Heart sounds: Normal heart sounds. No murmur heard.    No friction rub. No gallop.  Pulmonary:     Effort: Pulmonary effort is normal. No respiratory distress.     Breath sounds: Normal breath sounds. No wheezing or rales.  Chest:     Chest wall: No tenderness.  Abdominal:     General: Bowel sounds are normal. There is no distension.     Palpations: Abdomen is soft. There is no mass.     Tenderness: There is no abdominal tenderness. There is no guarding or rebound.   Musculoskeletal:        General:  Tenderness present. Normal range of motion.     Cervical back: Normal range of motion.     Right lower leg: No edema.     Left lower leg: No edema.  Lymphadenopathy:     Cervical: No cervical adenopathy.   Skin:    General: Skin is warm and dry.     Findings: No rash.   Neurological:     Mental Status: He is alert and oriented to person, place, and time.     Cranial Nerves: No cranial nerve deficit.     Motor: No abnormal muscle tone.     Coordination: Coordination normal.     Gait: Gait normal.     Deep Tendon Reflexes: Reflexes are normal and symmetric.   Psychiatric:        Behavior: Behavior normal.        Thought Content: Thought content normal.        Judgment: Judgment normal.   Neck - pain w/ROM Soft callus on the L 5th toe   Lab Results  Component Value Date   WBC 3.7 (L) 05/26/2023   HGB 15.0 05/26/2023   HCT 42.5 05/26/2023   PLT 154.0 05/26/2023   GLUCOSE 120 (H) 05/26/2023   CHOL 174 02/14/2023   TRIG 124.0 02/14/2023   HDL 31.40 (L) 02/14/2023   LDLDIRECT 159.0 02/11/2021   LDLCALC 117 (H) 02/14/2023   ALT 21 05/26/2023   AST 17 05/26/2023   NA 139 05/26/2023   K 4.0 05/26/2023   CL 103 05/26/2023   CREATININE 0.97 05/26/2023   BUN 12 05/26/2023   CO2 27 05/26/2023   TSH 1.48 02/14/2023   PSA 0.22 02/14/2023   HGBA1C 5.1 05/12/2009    No results found.  Assessment & Plan:   Problem List Items Addressed This Visit     Essential hypertension   On Micardis  and Norvasc       Neck pain   Musculoskeletal pain  Norco prn  Potential benefits of a long term opioids use as well as potential risks (i.e. addiction risk, apnea etc) and complications (i.e. Somnolence, constipation and others) were explained to the patient and were aknowledged.      Insomnia   Continue with zolpidem   Potential benefits of a long term benzodiazepines  use as well as potential risks  and complications were explained to the patient and were aknowledged.       Foot  pain - Primary    L foot 5th digit pain/corn Podiatry ref      Relevant Orders   Ambulatory referral to Podiatry      Meds ordered this encounter  Medications   HYDROcodone -acetaminophen  (NORCO/VICODIN) 5-325 MG tablet    Sig:  Take 1 tablet by mouth 2 (two) times daily as needed for severe pain (pain score 7-10).    Dispense:  60 tablet    Refill:  0    Schedule office visit every 3 months      Follow-up: Return in about 3 months (around 11/24/2023) for a follow-up visit.  Anitra Barn, MD

## 2023-08-24 NOTE — Assessment & Plan Note (Signed)
On Micardis and Norvasc

## 2023-08-24 NOTE — Assessment & Plan Note (Signed)
 Continue with zolpidem  Potential benefits of a long term benzodiazepines  use as well as potential risks  and complications were explained to the patient and were aknowledged.

## 2023-08-29 ENCOUNTER — Other Ambulatory Visit (HOSPITAL_COMMUNITY): Payer: Self-pay

## 2023-09-06 ENCOUNTER — Ambulatory Visit (INDEPENDENT_AMBULATORY_CARE_PROVIDER_SITE_OTHER)

## 2023-09-06 ENCOUNTER — Ambulatory Visit (INDEPENDENT_AMBULATORY_CARE_PROVIDER_SITE_OTHER): Admitting: Podiatry

## 2023-09-06 DIAGNOSIS — M205X2 Other deformities of toe(s) (acquired), left foot: Secondary | ICD-10-CM

## 2023-09-06 NOTE — Progress Notes (Addendum)
  Subjective:  Patient ID: Dale Campbell, male    DOB: March 24, 1960,   MRN: [id number]  Chief Complaint  Patient presents with   Dale Campbell    Rm22 Hammertoe Left 5th with corn/ 2 months hurts with pressure and shoe gear    63 y.o. male presents for concern of left foot pain that has been ongoing for about two months. Relates most of the pain is with the left fifth Campbell and a corn on top. Relates pressure and certain shoes cause trouble. Denies any treatments.   . Denies any other pedal complaints. Denies n/v/f/c.   Past Medical History:  Diagnosis Date   Allergic rhinitis    Allergy    CELLULITIS, ARM 04/30/2009   Qualifier: Diagnosis of  By: Ronnald Ards     Eczema    ED (erectile dysfunction)    Elevated glucose    Fatty liver    GERD (gastroesophageal reflux disease)    HTN (hypertension)    Hyperlipidemia    Internal hemorrhoids with bleeding, Grade 2 prolapse 05/06/2013       Personal history of colonic adenomas 04/01/2013   Pneumothorax    Snoring    ? poss OSA    Objective:  Physical Exam: Vascular: DP/PT pulses 2/4 bilateral. CFT <3 seconds. Normal hair growth on digits. No edema.  Skin. No lacerations or abrasions bilateral feet. Hyperkeratotic tissue noted to medial of left fifth digit.   Musculoskeletal: MMT 5/5 bilateral lower extremities in DF, PF, Inversion and Eversion. Deceased ROM in DF of ankle joint. Adductovarus of fifth digit on right tender to dorsum in area of corn.  Neurological: Sensation intact to light touch.   Assessment:   1. Capsulitis of metatarsophalangeal (MTP) joint of left foot      Plan:  Patient was evaluated and treated and all questions answered. -X-rays reviewed. No acute fractures or dislocations noted. Abductovarus hammered digit of left fifth Campbell noted.  -Educated on hammertoes and treatment options  -Hyperkeratotic tissue debrided as courtesy  -Discussed padding including Campbell caps and crest pads.  -Discussed need for  potential surgery if pain does not improved.  -Patient to follow-up as needed. Discussed calling if any changes or increased pain.    Asberry Failing, DPM

## 2023-09-27 ENCOUNTER — Other Ambulatory Visit: Payer: Self-pay | Admitting: Internal Medicine

## 2023-09-27 NOTE — Telephone Encounter (Signed)
 08/24/23 60 tabs/0 RF

## 2023-09-27 NOTE — Telephone Encounter (Signed)
 Copied from CRM [id number]. Topic: Clinical - Medication Refill >> Sep 27, 2023  9:26 AM Geroldine GRADE wrote: Medication: HYDROcodone -acetaminophen  (NORCO/VICODIN) 5-325 MG tablet   Has the patient contacted their pharmacy? Yes (Agent: If no, request that the patient contact the pharmacy for the refill. If patient does not wish to contact the pharmacy document the reason why and proceed with request.) (Agent: If yes, when and what did the pharmacy advise?)  This is the patient's preferred pharmacy:  [id number] [address] [facility] - [address] [place] [address] Phone: [phone] Fax: [phone]   Is this the correct pharmacy for this prescription? Yes If no, delete pharmacy and type the correct one.   Has the prescription been filled recently? Yes  Is the patient out of the medication? No  Has the patient been seen for an appointment in the last year OR does the patient have an upcoming appointment? Yes  Can we respond through MyChart? Yes  Agent: Please be advised that Rx refills may take up to 3 business days. We ask that you follow-up with your pharmacy.

## 2023-09-30 MED ORDER — HYDROCODONE-ACETAMINOPHEN 5-325 MG PO TABS
1.0000 | ORAL_TABLET | Freq: Two times a day (BID) | ORAL | 0 refills | Status: DC | PRN
Start: 2023-09-30 — End: 2023-10-23

## 2023-10-02 ENCOUNTER — Encounter: Payer: Self-pay | Admitting: Internal Medicine

## 2023-10-23 ENCOUNTER — Other Ambulatory Visit: Payer: Self-pay | Admitting: Internal Medicine

## 2023-10-23 NOTE — Telephone Encounter (Signed)
 Copied from CRM [id number]. Topic: Clinical - Medication Refill >> Oct 23, 2023 10:20 AM Rosina BIRCH wrote: Medication: omeprazole , telmisartan , zolpidem , clonazepam  and hydrocodone   Has the patient contacted their pharmacy? No (Agent: If no, request that the patient contact the pharmacy for the refill. If patient does not wish to contact the pharmacy document the reason why and proceed with request.) (Agent: If yes, when and what did the pharmacy advise?)  This is the patient's preferred pharmacy:   [id number] - [place], [place] - [address] AT [facility] OF [address] [place] [address] Phone: [phone] Fax: [phone]  Is this the correct pharmacy for this prescription? Yes If no, delete pharmacy and type the correct one.   Has the prescription been filled recently? No  Is the patient out of the medication? Yes  Has the patient been seen for an appointment in the last year OR does the patient have an upcoming appointment? Yes  Can we respond through MyChart? Yes  Agent: Please be advised that Rx refills may take up to 3 business days. We ask that you follow-up with your pharmacy.

## 2023-10-25 MED ORDER — ZOLPIDEM TARTRATE 10 MG PO TABS: ORAL_TABLET | ORAL | 1 refills | Status: DC

## 2023-10-25 MED ORDER — TELMISARTAN 80 MG PO TABS: 80.0000 mg | ORAL_TABLET | Freq: Every day | ORAL | 3 refills | Status: AC

## 2023-10-25 MED ORDER — CLONAZEPAM 0.5 MG PO TABS: ORAL_TABLET | ORAL | 2 refills | Status: DC

## 2023-10-25 MED ORDER — OMEPRAZOLE 20 MG PO CPDR: 20.0000 mg | DELAYED_RELEASE_CAPSULE | Freq: Every day | ORAL | 3 refills | Status: DC

## 2023-10-25 MED ORDER — HYDROCODONE-ACETAMINOPHEN 5-325 MG PO TABS: 1.0000 | ORAL_TABLET | Freq: Two times a day (BID) | ORAL | 0 refills | Status: DC | PRN

## 2023-11-14 ENCOUNTER — Encounter

## 2023-11-16 ENCOUNTER — Telehealth: Payer: Self-pay | Admitting: Internal Medicine

## 2023-11-16 NOTE — Telephone Encounter (Unsigned)
 Copied from CRM [id number]. Topic: Clinical - Medication Refill >> Nov 16, 2023 11:57 AM [place] M wrote: Medication: clonazePAM  (KLONOPIN ) 0.5 MG tablet HYDROcodone -acetaminophen  (NORCO/VICODIN) 5-325 MG tablet  Has the patient contacted their pharmacy? Yes (Agent: If no, request that the patient contact the pharmacy for the refill. If patient does not wish to contact the pharmacy document the reason why and proceed with request.) (Agent: If yes, when and what did the pharmacy advise?)  This is the patient's preferred pharmacy:    [id number] - [place], [place] - [address] AT [facility] OF [address] [place] [address] Phone: [phone] Fax: [phone]  Is this the correct pharmacy for this prescription? Yes If no, delete pharmacy and type the correct one.   Has the prescription been filled recently? Yes  Is the patient out of the medication? Yes  Has the patient been seen for an appointment in the last year OR does the patient have an upcoming appointment? Yes  Can we respond through MyChart? Yes  Agent: Please be advised that Rx refills may take up to 3 business days. We ask that you follow-up with your pharmacy.

## 2023-11-17 MED ORDER — CLONAZEPAM 0.5 MG PO TABS: ORAL_TABLET | ORAL | 2 refills | Status: DC

## 2023-11-17 MED ORDER — HYDROCODONE-ACETAMINOPHEN 5-325 MG PO TABS: 1.0000 | ORAL_TABLET | Freq: Two times a day (BID) | ORAL | 0 refills | Status: DC | PRN

## 2023-11-17 NOTE — Telephone Encounter (Signed)
 Okay.  Thanks.

## 2023-11-22 ENCOUNTER — Other Ambulatory Visit: Payer: Self-pay | Admitting: Internal Medicine

## 2023-11-22 NOTE — Telephone Encounter (Unsigned)
 Copied from CRM [id number]. Topic: Clinical - Medication Refill >> Nov 16, 2023 11:57 AM [place] M wrote: Medication: clonazePAM  (KLONOPIN ) 0.5 MG tablet HYDROcodone -acetaminophen  (NORCO/VICODIN) 5-325 MG tablet  Has the patient contacted their pharmacy? Yes (Agent: If no, request that the patient contact the pharmacy for the refill. If patient does not wish to contact the pharmacy document the reason why and proceed with request.) (Agent: If yes, when and what did the pharmacy advise?)  This is the patient's preferred pharmacy:    [id number] - [place], [place] - [address] AT [facility] OF [address] [place] [address] Phone: [phone] Fax: [phone]  Is this the correct pharmacy for this prescription? Yes If no, delete pharmacy and type the correct one.   Has the prescription been filled recently? Yes  Is the patient out of the medication? Yes  Has the patient been seen for an appointment in the last year OR does the patient have an upcoming appointment? Yes  Can we respond through MyChart? Yes  Agent: Please be advised that Rx refills may take up to 3 business days. We ask that you follow-up with your pharmacy. >> Nov 22, 2023 10:20 AM Montie POUR wrote: The above medications were sent to [facility] and not [facility]. Please send orders to refill medications to [facility] listed above. He only has 2 or 3 pills left of each medication.

## 2023-11-27 ENCOUNTER — Encounter: Admitting: Internal Medicine

## 2023-11-27 ENCOUNTER — Telehealth: Payer: Self-pay | Admitting: Radiology

## 2023-11-27 NOTE — Telephone Encounter (Signed)
 Copied from CRM [id number]. Topic: Clinical - Prescription Issue >> Nov 27, 2023 12:57 PM Berneda FALCON wrote: Reason for CRM: Pt states he is almost out of medication and this was requested last Thursday 11/23/23.  This is for   Medication: clonazePAM  (KLONOPIN ) 0.5 MG tablet HYDROcodone -acetaminophen  (NORCO/VICODIN) 5-325 MG tablet  These were initially sent to [facility], which he does not use. Please make sure to send this to:  [id number] - [place], [place] - [address] AT [facility] OF [address] [place] [address] Phone: [phone] Fax: [phone]

## 2023-11-30 ENCOUNTER — Other Ambulatory Visit: Payer: Self-pay

## 2023-11-30 ENCOUNTER — Other Ambulatory Visit: Payer: Self-pay | Admitting: Internal Medicine

## 2023-11-30 ENCOUNTER — Telehealth: Payer: Self-pay

## 2023-11-30 MED ORDER — CLONAZEPAM 0.5 MG PO TABS: ORAL_TABLET | ORAL | 2 refills | Status: AC

## 2023-11-30 NOTE — Telephone Encounter (Signed)
 Telephone encounter created and forwarded to Dr. Garald regarding this.

## 2023-11-30 NOTE — Telephone Encounter (Unsigned)
 Copied from CRM [id number]. Topic: Clinical - Prescription Issue >> Nov 27, 2023 12:57 PM Berneda FALCON wrote: Reason for CRM: Pt states he is almost out of medication and this was requested last Thursday 11/23/23.  This is for   Medication: clonazePAM  (KLONOPIN ) 0.5 MG tablet HYDROcodone -acetaminophen  (NORCO/VICODIN) 5-325 MG tablet  These were initially sent to [facility], which he does not use. Please make sure to send this to:  [id number] - [place], [place] - [address] AT [facility] OF [address] [place] [address] Phone: [phone] Fax: [phone] >> Nov 30, 2023 11:00 AM [place] E wrote: Patient still has not received these two medications. Relayed that the receipt was confirmed by [facility] mail order pharmacy on 9/5, but patient has not received a message or a call relaying that they got the prescriptions.

## 2023-11-30 NOTE — Telephone Encounter (Signed)
 Dr. Volney,   I called patient and his Clonzepam was sent [facility] when it needed to be sent to [facility]. The pharmacy has been changed to the correct pharmacy. He has been waitng since last week if this can get sent back in, thank you.   Curtistine Quiet, CMA

## 2023-11-30 NOTE — Telephone Encounter (Signed)
 Done. Thx.

## 2023-12-01 MED ORDER — HYDROCODONE-ACETAMINOPHEN 5-325 MG PO TABS: 1.0000 | ORAL_TABLET | Freq: Two times a day (BID) | ORAL | 0 refills | Status: DC | PRN

## 2023-12-05 ENCOUNTER — Other Ambulatory Visit: Payer: Self-pay | Admitting: Internal Medicine

## 2023-12-05 NOTE — Telephone Encounter (Unsigned)
 Copied from CRM #[id number]. Topic: Clinical - Medication Refill >> Dec 05, 2023  9:20 AM Rosina BIRCH wrote: Medication: zolpidem  (AMBIEN ) 10 MG tablet   Has the patient contacted their pharmacy? yes (Agent: If no, request that the patient contact the pharmacy for the refill. If patient does not wish to contact the pharmacy document the reason why and proceed with request.) (Agent: If yes, when and what did the pharmacy advise?)  This is the patient's preferred pharmacy:  [id number] - [place], [place] - [address] AT [facility] [address]  [place] [address] Phone: [phone] Fax: [phone]  Is this the correct pharmacy for this prescription? Yes If no, delete pharmacy and type the correct one.   Has the prescription been filled recently? Yes  Is the patient out of the medication? Yes  Has the patient been seen for an appointment in the last year OR does the patient have an upcoming appointment? Yes  Can we respond through MyChart? Yes  Agent: Please be advised that Rx refills may take up to 3 business days. We ask that you follow-up with your pharmacy.

## 2023-12-07 MED ORDER — ZOLPIDEM TARTRATE 10 MG PO TABS: ORAL_TABLET | ORAL | 1 refills | Status: AC

## 2023-12-18 ENCOUNTER — Ambulatory Visit: Admitting: Internal Medicine

## 2023-12-18 ENCOUNTER — Other Ambulatory Visit: Payer: Self-pay | Admitting: Internal Medicine

## 2023-12-18 ENCOUNTER — Encounter: Payer: Self-pay | Admitting: Internal Medicine

## 2023-12-18 VITALS — BP 132/80 | HR 69 | Temp 97.9°F | Ht 72.0 in | Wt 186.0 lb

## 2023-12-18 DIAGNOSIS — G4709 Other insomnia: Secondary | ICD-10-CM

## 2023-12-18 DIAGNOSIS — F419 Anxiety disorder, unspecified: Secondary | ICD-10-CM

## 2023-12-18 DIAGNOSIS — R944 Abnormal results of kidney function studies: Secondary | ICD-10-CM

## 2023-12-18 DIAGNOSIS — Z23 Encounter for immunization: Secondary | ICD-10-CM | POA: Diagnosis not present

## 2023-12-18 DIAGNOSIS — I10 Essential (primary) hypertension: Secondary | ICD-10-CM | POA: Diagnosis not present

## 2023-12-18 MED ORDER — CELECOXIB 200 MG PO CAPS: 200.0000 mg | ORAL_CAPSULE | Freq: Two times a day (BID) | ORAL | 1 refills | Status: DC | PRN

## 2023-12-18 NOTE — Assessment & Plan Note (Addendum)
 Hydrate well - resolved Monitor GFR

## 2023-12-18 NOTE — Progress Notes (Signed)
 Subjective:  Patient ID: Dale Campbell, male    DOB: 04-20-1960  Age: 63 y.o. MRN: [id number]  CC: Follow-up   HPI Dale Campbell presents for LBP, insomnia Father died in 2023/12/04  15 yo C/o pain in joints, hands  Outpatient Medications Prior to Visit  Medication Sig Dispense Refill   Ascorbic Acid (VITAMIN C PO) Take by mouth.     Cholecalciferol 1000 UNITS capsule Take 1,000 Units by mouth daily.     clonazePAM  (KLONOPIN ) 0.5 MG tablet TAKE 1 TABLET(0.5 MG) BY MOUTH TWICE DAILY AS NEEDED FOR ANXIETY OR SPASMS 60 tablet 2   HYDROcodone -acetaminophen  (NORCO/VICODIN) 5-325 MG tablet Take 1 tablet by mouth 2 (two) times daily as needed for severe pain (pain score 7-10). 60 tablet 0   omeprazole  (PRILOSEC) 20 MG capsule Take 1 capsule (20 mg total) by mouth daily. 90 capsule 3   telmisartan  (MICARDIS ) 80 MG tablet Take 1 tablet (80 mg total) by mouth daily. TAKE 1 TABLET(80 MG) BY MOUTH DAILY 90 tablet 3   zolpidem  (AMBIEN ) 10 MG tablet TAKE 1 TABLET(10 MG) BY MOUTH AT BEDTIME AS NEEDED FOR SLEEP 90 tablet 1   ciprofloxacin -dexamethasone  (CIPRODEX ) OTIC suspension Place 4 drops into the right ear 2 (two) times daily. 7.5 mL 0   methylPREDNISolone  (MEDROL  DOSEPAK) 4 MG TBPK tablet Take prescribed medication as directed for 5 days.  Medication will be tapered over 5-day period.  Take entire dose of medication until complete. 21 each 0   No facility-administered medications prior to visit.    ROS: Review of Systems  Constitutional:  Negative for appetite change, fatigue and unexpected weight change.  HENT:  Negative for congestion, nosebleeds, sneezing, sore throat and trouble swallowing.   Eyes:  Negative for itching and visual disturbance.  Respiratory:  Negative for cough.   Cardiovascular:  Negative for chest pain, palpitations and leg swelling.  Gastrointestinal:  Negative for abdominal distention, blood in stool, diarrhea and nausea.  Genitourinary:  Negative for  frequency and hematuria.  Musculoskeletal:  Positive for arthralgias and back pain. Negative for gait problem, joint swelling and neck pain.  Skin:  Negative for rash.  Neurological:  Negative for dizziness, tremors, speech difficulty and weakness.  Psychiatric/Behavioral:  Positive for sleep disturbance. Negative for agitation, dysphoric mood and suicidal ideas. The patient is nervous/anxious.     Objective:  BP 132/80 (BP Location: Left Arm, Patient Position: Sitting, Cuff Size: Normal)   Pulse 69   Temp 97.9 F (36.6 C) (Oral)   Ht 6' (1.829 m)   Wt 186 lb (84.4 kg)   SpO2 98%   BMI 25.23 kg/m   BP Readings from Last 3 Encounters:  12/18/23 132/80  08/24/23 124/60  06/14/23 (!) 148/77    Wt Readings from Last 3 Encounters:  12/18/23 186 lb (84.4 kg)  08/24/23 183 lb (83 kg)  02/16/23 188 lb (85.3 kg)    Physical Exam Constitutional:      General: He is not in acute distress.    Appearance: He is well-developed.     Comments: NAD  Eyes:     Conjunctiva/sclera: Conjunctivae normal.     Pupils: Pupils are equal, round, and reactive to light.  Neck:     Thyroid : No thyromegaly.     Vascular: No JVD.  Cardiovascular:     Rate and Rhythm: Normal rate and regular rhythm.     Heart sounds: Normal heart sounds. No murmur heard.    No friction rub.  No gallop.  Pulmonary:     Effort: Pulmonary effort is normal. No respiratory distress.     Breath sounds: Normal breath sounds. No wheezing or rales.  Chest:     Chest wall: No tenderness.  Abdominal:     General: Bowel sounds are normal. There is no distension.     Palpations: Abdomen is soft. There is no mass.     Tenderness: There is no abdominal tenderness. There is no guarding or rebound.  Musculoskeletal:        General: Tenderness present. Normal range of motion.     Cervical back: Normal range of motion.  Lymphadenopathy:     Cervical: No cervical adenopathy.  Skin:    General: Skin is warm and dry.      Findings: No rash.  Neurological:     Mental Status: He is alert and oriented to person, place, and time.     Cranial Nerves: No cranial nerve deficit.     Motor: No abnormal muscle tone.     Coordination: Coordination normal.     Gait: Gait normal.     Deep Tendon Reflexes: Reflexes are normal and symmetric.  Psychiatric:        Behavior: Behavior normal.        Thought Content: Thought content normal.        Judgment: Judgment normal.   LS, hands, knees w/pain on ROM  Lab Results  Component Value Date   WBC 3.7 (L) 05/26/2023   HGB 15.0 05/26/2023   HCT 42.5 05/26/2023   PLT 154.0 05/26/2023   GLUCOSE 120 (H) 05/26/2023   CHOL 174 02/14/2023   TRIG 124.0 02/14/2023   HDL 31.40 (L) 02/14/2023   LDLDIRECT 159.0 02/11/2021   LDLCALC 117 (H) 02/14/2023   ALT 21 05/26/2023   AST 17 05/26/2023   NA 139 05/26/2023   K 4.0 05/26/2023   CL 103 05/26/2023   CREATININE 0.97 05/26/2023   BUN 12 05/26/2023   CO2 27 05/26/2023   TSH 1.48 02/14/2023   PSA 0.22 02/14/2023   HGBA1C 5.1 05/12/2009    No results found.  Assessment & Plan:   Problem List Items Addressed This Visit     Anxiety   Chronic Clonazepam  prn rare. Not to take w/Zolpidem   Potential benefits of a long term benzodiazepines  use as well as potential risks  and complications were explained to the patient and were aknowledged. Alm changed jobs. 2 aunts are in Hospice...passed away. Parents are M 37.  F 93 w/dementia died in 24-Dec-2023      Decreased GFR   Hydrate well - resolved Monitor GFR      Essential hypertension   On Micardis  and Norvasc       Insomnia   Continue with zolpidem   Potential benefits of a long term benzodiazepines  use as well as potential risks  and complications were explained to the patient and were aknowledged.       Other Visit Diagnoses       Flu vaccine need    -  Primary   Relevant Orders   Flu vaccine trivalent PF, 6mos and older(Flulaval,Afluria,Fluarix,Fluzone)  (Completed)         Meds ordered this encounter  Medications   celecoxib (CELEBREX) 200 MG capsule    Sig: Take 1 capsule (200 mg total) by mouth 2 (two) times daily as needed.    Dispense:  60 capsule    Refill:  1      Follow-up: Return  in about 3 months (around 03/19/2024) for a follow-up visit.  Marolyn Noel, MD

## 2023-12-18 NOTE — Assessment & Plan Note (Signed)
 Continue with zolpidem  Potential benefits of a long term benzodiazepines  use as well as potential risks  and complications were explained to the patient and were aknowledged.

## 2023-12-18 NOTE — Assessment & Plan Note (Signed)
On Micardis and Norvasc

## 2023-12-18 NOTE — Assessment & Plan Note (Addendum)
 Chronic Clonazepam  prn rare. Not to take w/Zolpidem   Potential benefits of a long term benzodiazepines  use as well as potential risks  and complications were explained to the patient and were aknowledged. Alm changed jobs. 2 aunts are in Hospice...passed away. Parents are M 68.  F 93 w/dementia died in 12/22/2023

## 2024-01-12 ENCOUNTER — Other Ambulatory Visit: Payer: Self-pay | Admitting: Internal Medicine

## 2024-01-12 NOTE — Telephone Encounter (Signed)
 Copied from CRM #[id number]. Topic: Clinical - Medication Refill >> Jan 12, 2024  2:22 PM Dedra B wrote: Medication: HYDROcodone -acetaminophen  (NORCO/VICODIN) 5-325 MG tablet  Has the patient contacted their pharmacy? Yes, needed new prescription  This is the patient's preferred pharmacy:  [id number] - [place], [place] - [address] AT [facility] [address] [place] [address] Phone: [phone] Fax: [phone]  Is this the correct pharmacy for this prescription? Yes  Has the prescription been filled recently? No  Is the patient out of the medication? No  Has the patient been seen for an appointment in the last year OR does the patient have an upcoming appointment? Yes  Can we respond through MyChart? Yes  Agent: Please be advised that Rx refills may take up to 3 business days. We ask that you follow-up with your pharmacy.

## 2024-01-16 MED ORDER — HYDROCODONE-ACETAMINOPHEN 5-325 MG PO TABS: 1.0000 | ORAL_TABLET | Freq: Two times a day (BID) | ORAL | 0 refills | Status: DC | PRN

## 2024-02-19 ENCOUNTER — Telehealth: Payer: Self-pay | Admitting: Internal Medicine

## 2024-02-19 NOTE — Telephone Encounter (Unsigned)
 Copied from CRM #[id number]. Topic: Clinical - Medication Refill >> Feb 19, 2024  9:22 AM Berwyn MATSU wrote: Medication: omeprazole  (PRILOSEC) 20 MG capsule; patient is requesting 90 day supply   Has the patient contacted their pharmacy? Yes (Agent: If no, request that the patient contact the pharmacy for the refill. If patient does not wish to contact the pharmacy document the reason why and proceed with request.) (Agent: If yes, when and what did the pharmacy advise?)  This is the patient's preferred pharmacy:  [id number] - [place], [place] - [address] AT [facility] [address] [place] [address] Phone: [phone] Fax: [phone]  Is this the correct pharmacy for this prescription? Yes If no, delete pharmacy and type the correct one.   Has the prescription been filled recently? Yes  Is the patient out of the medication? Yes  Has the patient been seen for an appointment in the last year OR does the patient have an upcoming appointment? Yes  Can we respond through MyChart? Yes  Agent: Please be advised that Rx refills may take up to 3 business days. We ask that you follow-up with your pharmacy.

## 2024-02-20 MED ORDER — OMEPRAZOLE 20 MG PO CPDR: 20.0000 mg | DELAYED_RELEASE_CAPSULE | Freq: Every day | ORAL | 3 refills | Status: AC

## 2024-03-22 ENCOUNTER — Telehealth: Admitting: Nurse Practitioner

## 2024-03-22 ENCOUNTER — Ambulatory Visit: Payer: Self-pay

## 2024-03-22 DIAGNOSIS — K529 Noninfective gastroenteritis and colitis, unspecified: Secondary | ICD-10-CM

## 2024-03-22 MED ORDER — DICYCLOMINE HCL 20 MG PO TABS: 20.0000 mg | ORAL_TABLET | Freq: Three times a day (TID) | ORAL | 0 refills | Status: AC

## 2024-03-22 NOTE — Progress Notes (Signed)
" ° °  Established Patient Office Visit  An audio/visual tele-health visit was completed today for this patient. I connected with  Dale Campbell on 03/22/2024 utilizing audio/visual technology and verified that I am speaking with the correct person using two identifiers. The patient was located at their home, and I was located at the office of [facility] at [facility] during the encounter. I discussed the limitations of evaluation and management by telemedicine. The patient expressed understanding and agreed to proceed.  '  Subjective   Patient ID: Dale Campbell, male    DOB: August 15, 1960  Age: 64 y.o. MRN: [id number]  Chief Complaint  Patient presents with   Diarrhea    Been going for couple days now, pt has taken some imodium but has not helped. Pt stated is on and off issue that happens every year.    Discussed the use of AI scribe software for clinical note transcription with the patient, who gave verbal consent to proceed.  History of Present Illness Dale Campbell is a 64 year old male who presents with diarrhea for several days.  Diarrhea - Duration of approximately four days - Up to ten bowel movements per day, occurring both day and night - Initially explosive, now less intense - Minimal relief with Imodium - No blood or mucus in stool - No fever or chills - No nausea or vomiting - Urgency interferes with ability to work - Symptoms have been present intermittently for about ten years - No clear dietary or lifestyle triggers identified - Last seen by gastroenterologist in February 2024 for similar symptoms  Prior evaluations and treatments - Prior upper GI evaluation was unrevealing - Expired prescription for dicyclomine ; unsure of efficacy in past episodes  Psychosocial factors - Suspects stress may contribute to symptoms      Review of Systems  Constitutional:  Negative for chills and fever.  Respiratory:  Negative for cough and shortness  of breath.   Gastrointestinal:  Positive for diarrhea. Negative for abdominal pain, blood in stool, melena, nausea and vomiting.      Objective:     There were no vitals taken for this visit.   Physical Exam Comprehensive physical exam not completed today as office visit was conducted remotely.  Patient appears well over video no evidence of acute distress.  Patient was alert and oriented, and appeared to have appropriate judgment.   No results found for any visits on 03/22/24.    The 10-year ASCVD risk score (Arnett DK, et al., 2019) is: 16%    Assessment & Plan:   Problem List Items Addressed This Visit       Digestive   Gastroenteritis - Primary   Relevant Medications   dicyclomine  (BENTYL ) 20 MG tablet   Other Relevant Orders   Cdiff NAA+O+P+Stool Culture   Assessment and Plan Assessment & Plan Acute diarrhea, possible irritable bowel syndrome or gastroenteritis Acute diarrhea for four days, differential includes IBS and gastroenteritis, colitis. Previous episodes suggest possible stress-related IBS. Possible recent GI bug exposure. Dicyclomine  previously effective for cramping, Imodium provides limited relief. - Prescribed dicyclomine  TID with meals for short-term use (up to 10 day course provided) - Continue Imodium as needed. - Order stool testing for C. difficile if symptoms persist beyond the weekend. - Advised hydration with water, Gatorade, or Pedialyte.    Return if symptoms worsen or fail to improve.    Lauraine FORBES Pereyra, NP  "

## 2024-03-22 NOTE — Telephone Encounter (Signed)
" ° °  FYI Only or Action Required?: FYI only for provider: appointment scheduled on 01.09.26.  Patient was last seen in primary care on 12/18/2023 by Plotnikov, Karlynn GAILS, MD.  Called Nurse Triage reporting Diarrhea.  Symptoms began yesterday.  Interventions attempted: OTC medications: Imodium.  Symptoms are: unchanged.  Triage Disposition: See HCP Within 4 Hours (Or PCP Triage)  Patient/caregiver understands and will follow disposition?: Yes   Message from Ahlexyia S sent at 03/22/2024  7:48 AM EST  Reason for Triage: Pt called in stating that he has been having uncontrolled diarrhea and abdominal pain for a couple of days now. Pt stated that it isnt really a severe pain but more so cramping. Pt also mentioned that its been so bad he had to call out for the past two days. Pt denied having any other symptoms at this time. Pt is requesting to have a video visit.   Reason for Disposition  [1] SEVERE diarrhea (e.g., 7 or more times / day more than normal) AND [2] age > 60 years  Answer Assessment - Initial Assessment Questions 1. DIARRHEA SEVERITY: How bad is the diarrhea? How many more stools have you had in the past 24 hours than normal?      X 10-15 times uncontrolled  2. ONSET: When did the diarrhea begin?      X 2 days  3. STOOL DESCRIPTION:  How loose or watery is the diarrhea? What is the stool color? Is there any blood or mucous in the stool?     Watery, sometimes chunks  4. VOMITING: Are you also vomiting? If Yes, ask: How many times in the past 24 hours?       denies  5. ABDOMEN PAIN: Are you having any abdomen pain? If Yes, ask: What does it feel like? (e.g., crampy, dull, intermittent, constant)       Cramps- comes and goes  6. ABDOMEN PAIN SEVERITY: If present, ask: How bad is the pain?  (e.g., Scale 1-10; mild, moderate, or severe)      7/10  7. ORAL INTAKE: If vomiting, Have you been able to drink liquids? How much liquids have you had in  the past 24 hours?      Yes, fluid intake  8. HYDRATION: Pt denies dry mouth [not just dry lips], too weak to stand, dizziness, new weight loss, decreased urination  9. EXPOSURE: Have you traveled to a foreign country recently? Have you been exposed to anyone with diarrhea? Could you have eaten any food that was spoiled?      Denies  10. ANTIBIOTIC USE: Are you taking antibiotics now or have you taken antibiotics in the past 2 months?       Denies  11. OTHER SYMPTOMS: Pt denies fever, blood in stool       Pt reports nausea   Pt reports diarrhea and abdominal cramps Pt is taking OTC Imodium Pt scheduled for a virtual visit on  01.09.26 for further evaluation per pt's request due to lack of control of bowels Pt states diarrhea is a chronic issue and is being followed by PCP. ED precautions given if pt notices fever, vomit that taste like bile, worsening symptoms Pt agrees with plan of care, will call back for any worsening symptoms  Protocols used: Diarrhea-A-AH  "

## 2024-03-22 NOTE — Addendum Note (Signed)
 Addended by: ELNOR DOMINO E on: 03/22/2024 05:18 PM   Modules accepted: Level of Service

## 2024-04-16 ENCOUNTER — Encounter: Payer: Self-pay | Admitting: Internal Medicine

## 2024-04-17 ENCOUNTER — Other Ambulatory Visit: Payer: Self-pay | Admitting: Internal Medicine

## 2024-04-17 MED ORDER — HYDROCODONE-ACETAMINOPHEN 5-325 MG PO TABS: 1.0000 | ORAL_TABLET | Freq: Two times a day (BID) | ORAL | 0 refills | Status: AC | PRN

## 2024-04-24 ENCOUNTER — Ambulatory Visit: Admitting: Internal Medicine
